# Patient Record
Sex: Male | Born: 1935 | Race: White | Hispanic: No | Marital: Single | Smoking: Former smoker
Health system: Southern US, Community
[De-identification: ages and names within clinical notes are randomized; demographics above are authoritative.]

## PROBLEM LIST (undated history)

## (undated) DIAGNOSIS — R931 Abnormal findings on diagnostic imaging of heart and coronary circulation: Secondary | ICD-10-CM

## (undated) DIAGNOSIS — I700 Atherosclerosis of aorta: Secondary | ICD-10-CM

## (undated) DIAGNOSIS — F32A Depression, unspecified: Secondary | ICD-10-CM

## (undated) DIAGNOSIS — E538 Deficiency of other specified B group vitamins: Secondary | ICD-10-CM

## (undated) DIAGNOSIS — C801 Malignant (primary) neoplasm, unspecified: Secondary | ICD-10-CM

## (undated) DIAGNOSIS — I38 Endocarditis, valve unspecified: Secondary | ICD-10-CM

## (undated) DIAGNOSIS — H9190 Unspecified hearing loss, unspecified ear: Secondary | ICD-10-CM

## (undated) DIAGNOSIS — I2510 Atherosclerotic heart disease of native coronary artery without angina pectoris: Secondary | ICD-10-CM

## (undated) DIAGNOSIS — I10 Essential (primary) hypertension: Secondary | ICD-10-CM

## (undated) DIAGNOSIS — R7303 Prediabetes: Secondary | ICD-10-CM

## (undated) DIAGNOSIS — I499 Cardiac arrhythmia, unspecified: Secondary | ICD-10-CM

## (undated) DIAGNOSIS — E785 Hyperlipidemia, unspecified: Secondary | ICD-10-CM

## (undated) DIAGNOSIS — I429 Cardiomyopathy, unspecified: Secondary | ICD-10-CM

## (undated) DIAGNOSIS — I4891 Unspecified atrial fibrillation: Secondary | ICD-10-CM

## (undated) DIAGNOSIS — D494 Neoplasm of unspecified behavior of bladder: Secondary | ICD-10-CM

## (undated) DIAGNOSIS — Z7901 Long term (current) use of anticoagulants: Secondary | ICD-10-CM

## (undated) DIAGNOSIS — Z9289 Personal history of other medical treatment: Secondary | ICD-10-CM

## (undated) DIAGNOSIS — R011 Cardiac murmur, unspecified: Secondary | ICD-10-CM

## (undated) DIAGNOSIS — F419 Anxiety disorder, unspecified: Secondary | ICD-10-CM

## (undated) DIAGNOSIS — K429 Umbilical hernia without obstruction or gangrene: Secondary | ICD-10-CM

## (undated) HISTORY — PX: OTHER SURGICAL HISTORY: SHX169

---

## 1999-05-20 DIAGNOSIS — C801 Malignant (primary) neoplasm, unspecified: Secondary | ICD-10-CM

## 1999-05-20 DIAGNOSIS — Z9221 Personal history of antineoplastic chemotherapy: Secondary | ICD-10-CM

## 1999-05-20 DIAGNOSIS — C20 Malignant neoplasm of rectum: Secondary | ICD-10-CM

## 1999-05-20 HISTORY — DX: Malignant (primary) neoplasm, unspecified: C80.1

## 1999-05-20 HISTORY — PX: LAPAROSCOPIC LOW ANTERIOR RESECTION: SHX5904

## 1999-05-20 HISTORY — DX: Personal history of antineoplastic chemotherapy: Z92.21

## 1999-05-20 HISTORY — DX: Malignant neoplasm of rectum: C20

## 2002-05-19 HISTORY — PX: HERNIA REPAIR: SHX51

## 2004-10-14 ENCOUNTER — Ambulatory Visit: Payer: Self-pay | Admitting: Oncology

## 2005-04-16 ENCOUNTER — Ambulatory Visit: Payer: Self-pay | Admitting: Oncology

## 2005-04-18 ENCOUNTER — Ambulatory Visit: Payer: Self-pay | Admitting: Oncology

## 2005-10-20 ENCOUNTER — Ambulatory Visit: Payer: Self-pay | Admitting: Oncology

## 2005-10-23 ENCOUNTER — Ambulatory Visit: Payer: Self-pay | Admitting: Unknown Physician Specialty

## 2005-11-16 ENCOUNTER — Ambulatory Visit: Payer: Self-pay | Admitting: Oncology

## 2006-04-20 ENCOUNTER — Ambulatory Visit: Payer: Self-pay | Admitting: Oncology

## 2006-05-19 ENCOUNTER — Ambulatory Visit: Payer: Self-pay | Admitting: Oncology

## 2006-09-03 ENCOUNTER — Ambulatory Visit: Payer: Self-pay | Admitting: Unknown Physician Specialty

## 2006-09-17 ENCOUNTER — Ambulatory Visit: Payer: Self-pay | Admitting: Oncology

## 2006-09-22 ENCOUNTER — Ambulatory Visit: Payer: Self-pay | Admitting: Oncology

## 2006-10-18 ENCOUNTER — Ambulatory Visit: Payer: Self-pay | Admitting: Oncology

## 2007-03-20 ENCOUNTER — Ambulatory Visit: Payer: Self-pay | Admitting: Oncology

## 2007-03-23 ENCOUNTER — Ambulatory Visit: Payer: Self-pay | Admitting: Oncology

## 2007-04-19 ENCOUNTER — Ambulatory Visit: Payer: Self-pay | Admitting: Oncology

## 2008-03-19 ENCOUNTER — Ambulatory Visit: Payer: Self-pay | Admitting: Oncology

## 2008-03-21 ENCOUNTER — Ambulatory Visit: Payer: Self-pay | Admitting: Oncology

## 2008-04-18 ENCOUNTER — Ambulatory Visit: Payer: Self-pay | Admitting: Oncology

## 2008-11-16 ENCOUNTER — Ambulatory Visit: Payer: Self-pay | Admitting: Unknown Physician Specialty

## 2009-03-19 ENCOUNTER — Ambulatory Visit: Payer: Self-pay | Admitting: Oncology

## 2009-03-21 ENCOUNTER — Ambulatory Visit: Payer: Self-pay | Admitting: Oncology

## 2009-04-18 ENCOUNTER — Ambulatory Visit: Payer: Self-pay | Admitting: Oncology

## 2010-03-21 ENCOUNTER — Ambulatory Visit: Payer: Self-pay | Admitting: Oncology

## 2010-03-23 LAB — CEA: CEA: 2.6 ng/mL (ref 0.0–4.7)

## 2010-04-18 ENCOUNTER — Ambulatory Visit: Payer: Self-pay | Admitting: Oncology

## 2011-07-31 ENCOUNTER — Ambulatory Visit: Payer: Self-pay | Admitting: Unknown Physician Specialty

## 2013-06-06 ENCOUNTER — Ambulatory Visit: Payer: Self-pay | Admitting: Unknown Physician Specialty

## 2013-06-09 LAB — PATHOLOGY REPORT

## 2015-02-21 ENCOUNTER — Ambulatory Visit
Admission: RE | Admit: 2015-02-21 | Discharge: 2015-02-21 | Disposition: A | Discharge status: Home / Self Care | Payer: Medicare Other | Source: Ambulatory Visit | Attending: Internal Medicine | Admitting: Internal Medicine

## 2015-02-21 ENCOUNTER — Other Ambulatory Visit: Payer: Self-pay | Admitting: Internal Medicine

## 2015-02-21 DIAGNOSIS — M1612 Unilateral primary osteoarthritis, left hip: Secondary | ICD-10-CM | POA: Insufficient documentation

## 2015-02-21 DIAGNOSIS — M25552 Pain in left hip: Secondary | ICD-10-CM | POA: Insufficient documentation

## 2015-02-21 DIAGNOSIS — M25452 Effusion, left hip: Secondary | ICD-10-CM | POA: Insufficient documentation

## 2016-05-07 ENCOUNTER — Other Ambulatory Visit: Payer: Self-pay | Admitting: Cardiology

## 2016-05-07 DIAGNOSIS — R42 Dizziness and giddiness: Secondary | ICD-10-CM

## 2016-05-07 DIAGNOSIS — H9192 Unspecified hearing loss, left ear: Secondary | ICD-10-CM

## 2016-05-16 ENCOUNTER — Ambulatory Visit
Admission: RE | Admit: 2016-05-16 | Discharge: 2016-05-16 | Disposition: A | Discharge status: Home / Self Care | Payer: Medicare Other | Source: Ambulatory Visit | Attending: Cardiology | Admitting: Cardiology

## 2016-05-16 ENCOUNTER — Other Ambulatory Visit (HOSPITAL_COMMUNITY)
Admission: RE | Admit: 2016-05-16 | Discharge: 2016-05-16 | Disposition: A | Discharge status: Home / Self Care | Payer: Medicare Other | Source: Ambulatory Visit | Attending: Cardiology | Admitting: Cardiology

## 2016-05-16 DIAGNOSIS — I6782 Cerebral ischemia: Secondary | ICD-10-CM | POA: Insufficient documentation

## 2016-05-16 DIAGNOSIS — I10 Essential (primary) hypertension: Secondary | ICD-10-CM | POA: Insufficient documentation

## 2016-05-16 DIAGNOSIS — H9192 Unspecified hearing loss, left ear: Secondary | ICD-10-CM | POA: Insufficient documentation

## 2016-05-16 DIAGNOSIS — R42 Dizziness and giddiness: Secondary | ICD-10-CM

## 2016-05-16 LAB — BUN: BUN: 16 mg/dL (ref 6–20)

## 2016-05-16 LAB — CREATININE, SERUM
Creatinine, Ser: 0.83 mg/dL (ref 0.61–1.24)
GFR calc Af Amer: >60 mL/min (ref >60)
GFR calc non Af Amer: >60 mL/min (ref >60)

## 2016-05-16 MED ORDER — GADOBENATE DIMEGLUMINE 529 MG/ML IV SOLN
17.0000 mL | Freq: Once | INTRAVENOUS | Status: AC | PRN
  Administered 2016-05-16: 17 mL via INTRAVENOUS

## 2018-05-19 HISTORY — PX: EYE SURGERY: SHX253

## 2019-04-01 ENCOUNTER — Encounter: Payer: Self-pay | Admitting: *Deleted

## 2019-04-05 ENCOUNTER — Other Ambulatory Visit: Payer: Self-pay

## 2019-04-05 ENCOUNTER — Other Ambulatory Visit
Admission: RE | Admit: 2019-04-05 | Discharge: 2019-04-05 | Disposition: A | Discharge status: Home / Self Care | Payer: Medicare Other | Source: Ambulatory Visit | Attending: Ophthalmology | Admitting: Ophthalmology

## 2019-04-05 DIAGNOSIS — Z20828 Contact with and (suspected) exposure to other viral communicable diseases: Secondary | ICD-10-CM | POA: Insufficient documentation

## 2019-04-05 DIAGNOSIS — Z01812 Encounter for preprocedural laboratory examination: Secondary | ICD-10-CM | POA: Diagnosis present

## 2019-04-06 LAB — SARS CORONAVIRUS 2 (TAT 6-24 HRS): SARS Coronavirus 2: NEGATIVE

## 2019-04-08 ENCOUNTER — Other Ambulatory Visit: Payer: Self-pay

## 2019-04-08 ENCOUNTER — Ambulatory Visit: Payer: Medicare Other | Admitting: Certified Registered"

## 2019-04-08 ENCOUNTER — Ambulatory Visit
Admission: RE | Admit: 2019-04-08 | Discharge: 2019-04-08 | Disposition: A | Discharge status: Home / Self Care | Payer: Medicare Other | Attending: Ophthalmology | Admitting: Ophthalmology

## 2019-04-08 ENCOUNTER — Encounter
Admission: RE | Disposition: A | Discharge status: Home / Self Care | Payer: Self-pay | Source: Home / Self Care | Attending: Ophthalmology

## 2019-04-08 DIAGNOSIS — Z923 Personal history of irradiation: Secondary | ICD-10-CM | POA: Insufficient documentation

## 2019-04-08 DIAGNOSIS — H9190 Unspecified hearing loss, unspecified ear: Secondary | ICD-10-CM | POA: Diagnosis not present

## 2019-04-08 DIAGNOSIS — H2511 Age-related nuclear cataract, right eye: Secondary | ICD-10-CM | POA: Diagnosis present

## 2019-04-08 DIAGNOSIS — Z880 Allergy status to penicillin: Secondary | ICD-10-CM | POA: Insufficient documentation

## 2019-04-08 DIAGNOSIS — I4891 Unspecified atrial fibrillation: Secondary | ICD-10-CM | POA: Diagnosis not present

## 2019-04-08 DIAGNOSIS — Z87891 Personal history of nicotine dependence: Secondary | ICD-10-CM | POA: Insufficient documentation

## 2019-04-08 DIAGNOSIS — Z85048 Personal history of other malignant neoplasm of rectum, rectosigmoid junction, and anus: Secondary | ICD-10-CM | POA: Insufficient documentation

## 2019-04-08 DIAGNOSIS — Z7901 Long term (current) use of anticoagulants: Secondary | ICD-10-CM | POA: Diagnosis not present

## 2019-04-08 DIAGNOSIS — Z79899 Other long term (current) drug therapy: Secondary | ICD-10-CM | POA: Diagnosis not present

## 2019-04-08 DIAGNOSIS — Z9221 Personal history of antineoplastic chemotherapy: Secondary | ICD-10-CM | POA: Diagnosis not present

## 2019-04-08 HISTORY — PX: CATARACT EXTRACTION W/PHACO: SHX586

## 2019-04-08 HISTORY — DX: Personal history of other medical treatment: Z92.89

## 2019-04-08 HISTORY — DX: Cardiac arrhythmia, unspecified: I49.9

## 2019-04-08 HISTORY — DX: Unspecified hearing loss, unspecified ear: H91.90

## 2019-04-08 HISTORY — DX: Malignant (primary) neoplasm, unspecified: C80.1

## 2019-04-08 SURGERY — PHACOEMULSIFICATION, CATARACT, WITH IOL INSERTION
Anesthesia: Monitor Anesthesia Care | Site: Eye | Laterality: Right

## 2019-04-08 MED ORDER — SODIUM CHLORIDE 0.9 % IV SOLN
INTRAVENOUS | Status: DC
  Administered 2019-04-08: 08:00:00 via INTRAVENOUS

## 2019-04-08 MED ORDER — ONDANSETRON HCL 4 MG/2ML IJ SOLN
INTRAMUSCULAR | Status: DC | PRN
  Administered 2019-04-08: 4 mg via INTRAVENOUS

## 2019-04-08 MED ORDER — FENTANYL CITRATE (PF) 100 MCG/2ML IJ SOLN
INTRAMUSCULAR | Status: AC
  Filled 2019-04-08: qty 2

## 2019-04-08 MED ORDER — POVIDONE-IODINE 5 % OP SOLN
OPHTHALMIC | Status: DC | PRN
  Administered 2019-04-08: 1 via OPHTHALMIC

## 2019-04-08 MED ORDER — TETRACAINE HCL 0.5 % OP SOLN
OPHTHALMIC | Status: AC
  Administered 2019-04-08: 1 [drp] via OPHTHALMIC
  Filled 2019-04-08: qty 4

## 2019-04-08 MED ORDER — MIDAZOLAM HCL 2 MG/2ML IJ SOLN
INTRAMUSCULAR | Status: AC
  Filled 2019-04-08: qty 2

## 2019-04-08 MED ORDER — NA CHONDROIT SULF-NA HYALURON 40-17 MG/ML IO SOLN
INTRAOCULAR | Status: DC | PRN
  Administered 2019-04-08: 1 mL via INTRAOCULAR

## 2019-04-08 MED ORDER — MIDAZOLAM HCL 2 MG/2ML IJ SOLN
INTRAMUSCULAR | Status: DC | PRN
  Administered 2019-04-08 (×2): 1 mg via INTRAVENOUS

## 2019-04-08 MED ORDER — MOXIFLOXACIN HCL 0.5 % OP SOLN
OPHTHALMIC | Status: DC | PRN
  Administered 2019-04-08: 0.2 mL via OPHTHALMIC

## 2019-04-08 MED ORDER — ARMC OPHTHALMIC DILATING DROPS
OPHTHALMIC | Status: AC
  Administered 2019-04-08: 1 via OPHTHALMIC
  Filled 2019-04-08: qty 0.5

## 2019-04-08 MED ORDER — MOXIFLOXACIN HCL 0.5 % OP SOLN: 1.0000 [drp] | Freq: Once | OPHTHALMIC | Status: DC

## 2019-04-08 MED ORDER — ARMC OPHTHALMIC DILATING DROPS
1.0000 "application " | OPHTHALMIC | Status: AC
  Administered 2019-04-08 (×3): 1 via OPHTHALMIC

## 2019-04-08 MED ORDER — LIDOCAINE HCL (PF) 4 % IJ SOLN
INTRAOCULAR | Status: DC | PRN
  Administered 2019-04-08: 4 mL via OPHTHALMIC

## 2019-04-08 MED ORDER — MOXIFLOXACIN HCL 0.5 % OP SOLN
OPHTHALMIC | Status: AC
  Filled 2019-04-08: qty 3

## 2019-04-08 MED ORDER — EPINEPHRINE PF 1 MG/ML IJ SOLN
INTRAOCULAR | Status: DC | PRN
  Administered 2019-04-08: 09:00:00 via OPHTHALMIC

## 2019-04-08 MED ORDER — FENTANYL CITRATE (PF) 100 MCG/2ML IJ SOLN
INTRAMUSCULAR | Status: DC | PRN
  Administered 2019-04-08 (×2): 50 ug via INTRAVENOUS

## 2019-04-08 MED ORDER — TETRACAINE HCL 0.5 % OP SOLN
1.0000 [drp] | Freq: Once | OPHTHALMIC | Status: AC
  Administered 2019-04-08: 08:00:00 1 [drp] via OPHTHALMIC

## 2019-04-08 MED ORDER — CARBACHOL 0.01 % IO SOLN
INTRAOCULAR | Status: DC | PRN
  Administered 2019-04-08: 0.5 mL via INTRAOCULAR

## 2019-04-08 SURGICAL SUPPLY — 16 items
GLOVE BIO SURGEON STRL SZ8 (GLOVE) ×3 IMPLANT
GLOVE BIOGEL M 6.5 STRL (GLOVE) ×3 IMPLANT
GLOVE SURG LX 8.0 MICRO (GLOVE) ×2
GLOVE SURG LX STRL 8.0 MICRO (GLOVE) ×1 IMPLANT
GOWN STRL REUS W/ TWL LRG LVL3 (GOWN DISPOSABLE) ×2 IMPLANT
GOWN STRL REUS W/TWL LRG LVL3 (GOWN DISPOSABLE) ×4
LABEL CATARACT MEDS ST (LABEL) ×3 IMPLANT
LENS IOL TECNIS ITEC 17.0 (Intraocular Lens) ×2 IMPLANT
PACK CATARACT (MISCELLANEOUS) ×3 IMPLANT
PACK CATARACT BRASINGTON LX (MISCELLANEOUS) ×3 IMPLANT
PACK EYE AFTER SURG (MISCELLANEOUS) ×3 IMPLANT
SOL BSS BAG (MISCELLANEOUS) ×3
SOLUTION BSS BAG (MISCELLANEOUS) ×1 IMPLANT
SYR 5ML LL (SYRINGE) ×3 IMPLANT
WATER STERILE IRR 250ML POUR (IV SOLUTION) ×3 IMPLANT
WIPE NON LINTING 3.25X3.25 (MISCELLANEOUS) ×3 IMPLANT

## 2019-04-08 NOTE — Anesthesia Postprocedure Evaluation (Signed)
Anesthesia Post Note  Patient: GILLIE CRISCI  Procedure(s) Performed: CATARACT EXTRACTION PHACO AND INTRAOCULAR LENS PLACEMENT (IOC) RIGHT (Right Eye)  Patient location during evaluation: PACU Anesthesia Type: MAC Level of consciousness: awake and alert Pain management: pain level controlled Vital Signs Assessment: post-procedure vital signs reviewed and stable Respiratory status: spontaneous breathing, nonlabored ventilation, respiratory function stable and patient connected to nasal cannula oxygen Cardiovascular status: stable and blood pressure returned to baseline Postop Assessment: no apparent nausea or vomiting Anesthetic complications: no     Last Vitals:  Vitals:   04/08/19 0725 04/08/19 0918  BP: 140/89 118/68  Pulse: 85 69  Resp: 16 14  Temp: (!) 36.1 C (!) 36.3 C  SpO2: 100% 100%    Last Pain:  Vitals:   04/08/19 0918  TempSrc: Temporal  PainSc: 0-No pain                 Martha Clan

## 2019-04-08 NOTE — Anesthesia Preprocedure Evaluation (Signed)
Anesthesia Evaluation  Patient identified by MRN, date of birth, ID band Patient awake    Reviewed: Allergy & Precautions, H&P , NPO status , Patient's Chart, lab work & pertinent test results, reviewed documented beta blocker date and time   History of Anesthesia Complications Negative for: history of anesthetic complications  Airway Mallampati: I  TM Distance: >3 FB Neck ROM: full    Dental  (+) Dental Advidsory Given, Caps, Teeth Intact Permanent briges:   Pulmonary neg pulmonary ROS, former smoker,    Pulmonary exam normal        Cardiovascular Exercise Tolerance: Good (-) hypertension(-) angina(-) Past MI and (-) Cardiac Stents + dysrhythmias Atrial Fibrillation (-) Valvular Problems/Murmurs     Neuro/Psych negative neurological ROS  negative psych ROS   GI/Hepatic negative GI ROS, Neg liver ROS,   Endo/Other  negative endocrine ROS  Renal/GU negative Renal ROS  negative genitourinary   Musculoskeletal   Abdominal   Peds  Hematology negative hematology ROS (+)   Anesthesia Other Findings Past Medical History: No date: Cancer ([place])     Comment:  rectal cancer No date: Dysrhythmia     Comment:  atrial fibrillation No date: Hard of hearing No date: History of blood transfusion 2001: History of chemotherapy     Comment:  radiation too   Reproductive/Obstetrics negative OB ROS                             Anesthesia Physical Anesthesia Plan  ASA: II  Anesthesia Plan: MAC   Post-op Pain Management:    Induction:   PONV Risk Score and Plan:   Airway Management Planned: Natural Airway and Nasal Cannula  Additional Equipment:   Intra-op Plan:   Post-operative Plan:   Informed Consent: I have reviewed the patients History and Physical, chart, labs and discussed the procedure including the risks, benefits and alternatives for the proposed anesthesia with the patient or  authorized representative who has indicated his/her understanding and acceptance.     Dental Advisory Given  Plan Discussed with: Anesthesiologist, CRNA and Surgeon  Anesthesia Plan Comments:         Anesthesia Quick Evaluation

## 2019-04-08 NOTE — Discharge Instructions (Addendum)
AMBULATORY SURGERY  DISCHARGE INSTRUCTIONS   1) The drugs that you were given will stay in your system until tomorrow so for the next 24 hours you should not:  A) Drive an automobile B) Make any legal decisions C) Drink any alcoholic beverage   2) You may resume regular meals tomorrow.  Today it is better to start with liquids and gradually work up to solid foods.  You may eat anything you prefer, but it is better to start with liquids, then soup and crackers, and gradually work up to solid foods.   3) Please notify your doctor immediately if you have any unusual bleeding, trouble breathing, redness and pain at the surgery site, drainage, fever, or pain not relieved by medication.    4) Additional Instructions:        Please contact your physician with any problems or Same Day Surgery at [phone], Monday through Friday 6 am to 4 pm, or  at [facility] number at [phone].Eye Surgery Discharge Instructions    Expect mild scratchy sensation or mild soreness. DO NOT RUB YOUR EYE!  The day of surgery:  Minimal physical activity, but bed rest is not required  No reading, computer work, or close hand work  No bending, lifting, or straining.  May watch TV  For 24 hours:  No driving, legal decisions, or alcoholic beverages  Safety precautions  Eat anything you prefer: It is better to start with liquids, then soup then solid foods.  _____ Eye patch should be worn until postoperative exam tomorrow.  ____ Solar shield eyeglasses should be worn for comfort in the sunlight/patch while sleeping  Resume all regular medications including aspirin or Coumadin if these were discontinued prior to surgery. You may shower, bathe, shave, or wash your hair. Tylenol may be taken for mild discomfort.  Call your doctor if you experience significant pain, nausea, or vomiting, fever > 101 or other signs of infection. [phone] or [phone] Specific  instructions:  Follow-up Information    Birder Robson, MD Follow up on 04/08/2019.   Specialty: Ophthalmology Why: appointment time 3:15 PM Contact information: [address] [place] [address] [phone]

## 2019-04-08 NOTE — Anesthesia Post-op Follow-up Note (Signed)
Anesthesia QCDR form completed.        

## 2019-04-08 NOTE — Transfer of Care (Signed)
Immediate Anesthesia Transfer of Care Note  Patient: Sean Anthony  Procedure(s) Performed: CATARACT EXTRACTION PHACO AND INTRAOCULAR LENS PLACEMENT (IOC) RIGHT (Right Eye)  Patient Location: PACU  Anesthesia Type:MAC  Level of Consciousness: awake, alert  and oriented  Airway & Oxygen Therapy: Patient Spontanous Breathing and Patient connected to nasal cannula oxygen  Post-op Assessment: Report given to RN and Post -op Vital signs reviewed and stable  Post vital signs: Reviewed and stable  Last Vitals:  Vitals Value Taken Time  BP    Temp    Pulse    Resp    SpO2      Last Pain:  Vitals:   04/08/19 0725  TempSrc: Temporal  PainSc: 0-No pain         Complications: No apparent anesthesia complications

## 2019-04-08 NOTE — H&P (Signed)
All labs reviewed. Abnormal studies sent to patients PCP when indicated.  Previous H&P reviewed, patient examined, there are NO CHANGES.  Sean Kopplin Porfilio11/20/20207:18 AM

## 2019-04-08 NOTE — Op Note (Signed)
PREOPERATIVE DIAGNOSIS:  Nuclear sclerotic cataract of the right eye.   POSTOPERATIVE DIAGNOSIS:  NUCLEAR SCLEROTIC CATARACT RIGHT EYE   OPERATIVE PROCEDURE: Procedure(s): CATARACT EXTRACTION PHACO AND INTRAOCULAR LENS PLACEMENT ([place]) RIGHT   SURGEON:  Birder Robson, MD.   ANESTHESIA:  Anesthesiologist: Martha Clan, MD CRNA: Disser, Einar Grad, CRNA  1.      Managed anesthesia care. 2.      0.43ml of Shugarcaine was instilled in the eye following the paracentesis.   COMPLICATIONS:  None.   TECHNIQUE:   Stop and chop   DESCRIPTION OF PROCEDURE:  The patient was examined and consented in the preoperative holding area where the aforementioned topical anesthesia was applied to the right eye and then brought back to the Operating Room where the right eye was prepped and draped in the usual sterile ophthalmic fashion and a lid speculum was placed. A paracentesis was created with the side port blade and the anterior chamber was filled with viscoelastic. A near clear corneal incision was performed with the steel keratome. A continuous curvilinear capsulorrhexis was performed with a cystotome followed by the capsulorrhexis forceps. Hydrodissection and hydrodelineation were carried out with BSS on a blunt cannula. The lens was removed in a stop and chop  technique and the remaining cortical material was removed with the irrigation-aspiration handpiece. The capsular bag was inflated with viscoelastic and the Technis [id number]  lens was placed in the capsular bag without complication. The remaining viscoelastic was removed from the eye with the irrigation-aspiration handpiece. The wounds were hydrated. The anterior chamber was flushed with Miostat and the eye was inflated to physiologic pressure. 0.3ml of Vigamox was placed in the anterior chamber. The wounds were found to be water tight. The eye was dressed with Vigamox. The patient was given protective glasses to wear throughout the day and a shield with  which to sleep tonight. The patient was also given drops with which to begin a drop regimen today and will follow-up with me in one day. Implant Name Type Inv. Item Serial No. Manufacturer Lot No. LRB No. Used Action  LENS IOL DIOP 17.0 - [id number] Intraocular Lens LENS IOL DIOP 17.0 225 [id number] AMO  Right 1 Implanted   Procedure(s) with comments: CATARACT EXTRACTION PHACO AND INTRAOCULAR LENS PLACEMENT (IOC) RIGHT (Right) - [place] 00:56.6 CDE 10.34 FLUID PACK LOT # [id number]  Electronically signed: Birder Robson 04/08/2019 9:16 AM

## 2019-05-20 DIAGNOSIS — Z9841 Cataract extraction status, right eye: Secondary | ICD-10-CM

## 2019-05-20 HISTORY — DX: Cataract extraction status, right eye: Z98.41

## 2021-06-25 ENCOUNTER — Other Ambulatory Visit: Payer: Self-pay | Admitting: Gerontology

## 2021-06-25 DIAGNOSIS — R319 Hematuria, unspecified: Secondary | ICD-10-CM

## 2021-07-01 ENCOUNTER — Other Ambulatory Visit: Payer: Self-pay | Admitting: *Deleted

## 2021-07-01 DIAGNOSIS — R310 Gross hematuria: Secondary | ICD-10-CM

## 2021-07-02 ENCOUNTER — Other Ambulatory Visit: Payer: Self-pay

## 2021-07-02 ENCOUNTER — Ambulatory Visit: Payer: Medicare Other | Admitting: Urology

## 2021-07-02 ENCOUNTER — Encounter: Payer: Self-pay | Admitting: Urology

## 2021-07-02 VITALS — BP 114/55 | HR 86 | Ht 74.0 in | Wt 174.0 lb

## 2021-07-02 DIAGNOSIS — R310 Gross hematuria: Secondary | ICD-10-CM

## 2021-07-02 NOTE — Progress Notes (Signed)
° °  07/02/21 10:22 AM   Star Age [birth year] [id number]  CC: Gross hematuria  HPI: 86 year old male referred for gross hematuria.  He reports about 6 weeks of gross hematuria without clots that has been intermittent.  This resolved after stopping his Xarelto, and he has been back on it without gross hematuria for the last week.  Urinalysis was checked by PCP at time of gross hematuria and showed 10-50 RBCs but no evidence of infection.  He denies any urologic history, however has a history of rectal cancer treated with chemotherapy and radiation 20 years ago.  He has a 10-pack-year smoking history and quit 40 years ago, no other carcinogenic exposures.  He denies any dysuria or UTI symptoms.   PMH: Past Medical History:  Diagnosis Date   Cancer [facility])    rectal cancer   Dysrhythmia    atrial fibrillation   Hard of hearing    History of blood transfusion    History of chemotherapy 2001   radiation too    Surgical History: Past Surgical History:  Procedure Laterality Date   CATARACT EXTRACTION W/PHACO Right 04/08/2019   Procedure: CATARACT EXTRACTION PHACO AND INTRAOCULAR LENS PLACEMENT ([place]) RIGHT;  Surgeon: Birder Robson, MD;  Location: [facility] ORS;  Service: Ophthalmology;  Laterality: Right;  [place] 00:56.6 CDE 10.34 FLUID PACK LOT # [id number] H   dental implants     HERNIA REPAIR     LAPAROSCOPIC LOW ANTERIOR RESECTION  2001   also had chemo and radiation therapy    Family History: No family history on file.  Social History:  reports that he has quit smoking. He has never used smokeless tobacco. He reports current alcohol use. No history on file for drug use.  Physical Exam: BP (!) 114/55 (BP Location: Left Arm, Patient Position: Sitting, Cuff Size: Normal)    Pulse 86    Ht 6\' 2"  (1.88 m)    Wt 174 lb (78.9 kg)    BMI 22.34 kg/m    Constitutional:  Alert and oriented, No acute distress. Cardiovascular: No clubbing, cyanosis, or edema. Respiratory: Normal  respiratory effort, no increased work of breathing. GI: Abdomen is soft, nontender, nondistended, no abdominal masses  Assessment & Plan:   86 year old male with 10-pack-year smoking history and history of rectal cancer treated with surgery, radiation, and chemotherapy 20 years ago who presents with asymptomatic gross hematuria on Xarelto.  We discussed common possible etiologies of hematuria including BPH, malignancy, urolithiasis, medical renal disease, and idiopathic. Standard workup recommended by the AUA includes imaging with CT urogram to assess the upper tracts, and cystoscopy. Cytology is performed on patient's with gross hematuria to look for malignant cells in the urine.  CT hematuria and cystoscopy to complete work-up  Nickolas Madrid, MD 07/02/2021  [place] [address], [place], [place] [address] [phone]

## 2021-07-02 NOTE — Patient Instructions (Signed)

## 2021-08-07 ENCOUNTER — Other Ambulatory Visit: Payer: Medicare Other | Admitting: Urology

## 2021-08-20 ENCOUNTER — Ambulatory Visit
Admission: RE | Admit: 2021-08-20 | Discharge: 2021-08-20 | Disposition: A | Discharge status: Home / Self Care | Payer: Medicare Other | Source: Ambulatory Visit | Attending: Urology | Admitting: Urology

## 2021-08-20 ENCOUNTER — Other Ambulatory Visit: Payer: Self-pay

## 2021-08-20 DIAGNOSIS — R310 Gross hematuria: Secondary | ICD-10-CM | POA: Diagnosis present

## 2021-08-20 LAB — POCT I-STAT CREATININE: Creatinine, Ser: 0.9 mg/dL (ref 0.61–1.24)

## 2021-08-20 MED ORDER — IOHEXOL 300 MG/ML  SOLN: 100.0000 mL | Freq: Once | INTRAMUSCULAR | Status: DC | PRN

## 2021-08-20 MED ORDER — IOHEXOL 300 MG/ML  SOLN
125.0000 mL | Freq: Once | INTRAMUSCULAR | Status: AC | PRN
  Administered 2021-08-20: 125 mL via INTRAVENOUS

## 2021-08-21 ENCOUNTER — Encounter: Payer: Self-pay | Admitting: Urology

## 2021-08-21 ENCOUNTER — Other Ambulatory Visit
Admission: RE | Admit: 2021-08-21 | Discharge: 2021-08-21 | Disposition: A | Discharge status: Home / Self Care | Payer: Medicare Other | Attending: Urology | Admitting: Urology

## 2021-08-21 ENCOUNTER — Ambulatory Visit: Payer: Medicare Other | Admitting: Urology

## 2021-08-21 ENCOUNTER — Other Ambulatory Visit: Payer: Self-pay | Admitting: Urology

## 2021-08-21 VITALS — BP 122/73 | HR 77 | Ht 74.0 in | Wt 180.0 lb

## 2021-08-21 DIAGNOSIS — D494 Neoplasm of unspecified behavior of bladder: Secondary | ICD-10-CM

## 2021-08-21 DIAGNOSIS — R310 Gross hematuria: Secondary | ICD-10-CM

## 2021-08-21 LAB — URINALYSIS, COMPLETE (UACMP) WITH MICROSCOPIC
Bilirubin Urine: NEGATIVE
Glucose, UA: NEGATIVE mg/dL
Ketones, ur: NEGATIVE mg/dL
Leukocytes,Ua: NEGATIVE
Nitrite: NEGATIVE
Protein, ur: NEGATIVE mg/dL
RBC / HPF: >50 RBC/hpf (ref 0–5)
Specific Gravity, Urine: 1.02 (ref 1.005–1.030)
pH: 5.5 (ref 5.0–8.0)

## 2021-08-21 NOTE — Progress Notes (Signed)
Cystoscopy Procedure Note: ? ?Indication: Gross hematuria ? ?After informed consent and discussion of the procedure and its risks, Sean Anthony was positioned and prepped in the standard fashion. Cystoscopy was performed with a flexible cystoscope. The urethra, bladder neck and entire bladder was visualized in a standard fashion. The prostate was moderate in size.  There was a small 2 cm papillary lesion at the posterior wall concerning for bladder tumor.  Cytology sent ? ?Imaging: ?CT reviewed, asymmetric right and posterior bladder wall enhancement consistent with small tumor identified on cystoscopy today ? ?Findings: ?Small papillary bladder tumor. ~2cm ? ?-------------------------------------------------------------------------- ? ?Assessment and Plan: ?86 year old male who presented with gross hematuria and was found to have a 2 cm papillary bladder tumor on clinic cystoscopy today. ? ?We discussed transurethral resection of bladder tumor (TURBT) and risks and benefits at length. This is typically a 1 to 2-hour procedure done under general anesthesia in the operating room.  A scope is inserted through the urethra and used to resect abnormal tissue within the bladder, which is then sent to the pathologist to determine grade and stage of the tumor.  Risks include bleeding, infection, need for temporary Foley placement, and bladder perforation.  Treatment strategies are based on the type of tumor and depth of invasion.  We briefly reviewed the different treatment pathways for non-muscle invasive and muscle invasive bladder cancer. ? ?Schedule bladder biopsy and fulguration with gemcitabine ? ?Sean Madrid, MD ?08/21/2021  ? ?

## 2021-08-21 NOTE — Progress Notes (Signed)
Surgical Physician Order Form [facility] [place] ? ?* Scheduling expectation : Next Available ? ?*Length of Case: 1 hour ? ?*Clearance needed: no ? ?*Anticoagulation Instructions: Hold all anticoagulants ? ?*Aspirin Instructions: Hold Aspirin ? ?*Post-op visit Date/Instructions: 2 weeks discuss pathology ? ?*Diagnosis: Bladder Tumor ? ?*Procedure: Bladder biopsy and fulguration ? ? ?Additional orders: Gemcitabine '2000mg'$  bladder instillation ? ?-Admit type: OUTpatient ? ?-Anesthesia: General ? ?-VTE Prophylaxis Standing Order SCD?s    ?   ?Other:  ? ?-Standing Lab Orders Per Anesthesia   ? ?Lab other: UA&Urine Culture ? ?-Standing Test orders EKG/Chest x-ray per Anesthesia      ? ?Test other:  ? ?- Medications:  Ancef 2gm IV ? ?-Other orders:  N/A ? ? ? ?  ? ?

## 2021-08-21 NOTE — Patient Instructions (Signed)
Transurethral Resection of Bladder Tumor ?Transurethral resection of a bladder tumor is the removal (resection) of a cancerous growth (tumor) on the inside wall of the bladder. The bladder is the organ that holds urine. The tumor is removed through the tube that carries urine out of the body (urethra). ?In a transurethral resection, a thin telescope with a light, a tiny camera, and an electric cutting edge (resectoscope) is passed through the urethra. In men, the opening of the urethra is at the end of the penis. In women, it is just above the opening of the vagina. ?Tell a health care provider about: ?Any allergies you have. ?All medicines you are taking, including vitamins, herbs, eye drops, creams, and over-the-counter medicines. ?Any problems you or family members have had with anesthetic medicines. ?Any blood disorders you have. ?Any surgeries you have had. ?Any medical conditions you have. ?Any recent urinary tract infections you have had. ?Whether you are pregnant or may be pregnant. ?What are the risks? ?Generally, this is a safe procedure. However, problems may occur, including: ?Infection. ?Bleeding. ?Allergic reactions to medicines. ?Damage to nearby structures or organs, such as: ?The urethra. ?The tubes that drain urine from the kidneys into the bladder (ureters). ?Pain and burning during urination. ?Difficulty urinating due to partial blockage of the urethra. ?Inability to urinate (urinary retention). ?What happens before the procedure? ?Staying hydrated ?Follow instructions from your health care provider about hydration, which may include: ?Up to 2 hours before the procedure - you may continue to drink clear liquids, such as water, clear fruit juice, black coffee, and plain tea. ? ?Eating and drinking restrictions ?Follow instructions from your health care provider about eating and drinking, which may include: ?8 hours before the procedure - stop eating heavy meals or foods, such as meat, fried foods,  or fatty foods. ?6 hours before the procedure - stop eating light meals or foods, such as toast or cereal. ?6 hours before the procedure - stop drinking milk or drinks that contain milk. ?2 hours before the procedure - stop drinking clear liquids. ?Medicines ?Ask your health care provider about: ?Changing or stopping your regular medicines. This is especially important if you are taking diabetes medicines or blood thinners. ?Taking medicines such as aspirin and ibuprofen. These medicines can thin your blood. Do not take these medicines unless your health care provider tells you to take them. ?Taking over-the-counter medicines, vitamins, herbs, and supplements. ?Tests ?You may have exams or tests, including: ?Physical exam. ?Blood tests. ?Urine tests. ?Electrocardiogram (ECG). This test measures the electrical activity of the heart. ?General instructions ?Plan to have someone take you home from the hospital or clinic. ?Ask your health care provider how your surgical site will be marked or identified. ?Ask your health care provider what steps will be taken to help prevent infection. These may include: ?Washing skin with a germ-killing soap. ?Taking antibiotic medicine. ?What happens during the procedure? ?An IV will be inserted into one of your veins. ?You will be given one or more of the following: ?A medicine to help you relax (sedative). ?A medicine to make you fall asleep (general anesthetic). ?A medicine that is injected into your spine to numb the area below and slightly above the injection site (spinal anesthetic). ?Your legs will be placed in foot rests (stirrups) so that your legs are apart and your knees are bent. ?The resectoscope will be passed through your urethra and into your bladder. ?The part of your bladder that is affected by the tumor will be  resected using the cutting edge of the resectoscope. ?The resectoscope will be removed. ?A thin, flexible tube (catheter) will be passed through your urethra  and into your bladder. The catheter will drain urine into a bag outside of your body. ?Fluid may be passed through the catheter to keep the catheter open. ?The procedure may vary among health care providers and hospitals. ?What happens after the procedure? ?Your blood pressure, heart rate, breathing rate, and blood oxygen level will be monitored until you leave the hospital or clinic. ?You may continue to receive fluids and medicines through an IV. ?You will have some pain. You will be given pain medicine to relieve pain. ?You will have a catheter to drain your urine. ?You will have blood in your urine. Your catheter may be kept in until your urine is clear. ?The amount of urine will be monitored. If necessary, your bladder may be rinsed out (irrigated) by passing fluid through your catheter. ?You will be encouraged to walk around as soon as possible. ?You may have to wear compression stockings. These stockings help to prevent blood clots and reduce swelling in your legs. ?Do not drive for 24 hours if you were given a sedative during your procedure. ?Summary ?Transurethral resection of a bladder tumor is the removal (resection) of a cancerous growth (tumor) on the inside wall of the bladder. ?To do this procedure, your health care provider uses a thin telescope with a light, a tiny camera, and an electric cutting edge (resectoscope). ?Follow your health care provider's instructions. You may need to stop or change certain medicines, and you may be told to stop eating and drinking several hours before the procedure. ?Your blood pressure, heart rate, breathing rate, and blood oxygen level will be monitored until you leave the hospital or clinic. ?You may have to wear compression stockings. These stockings help to prevent blood clots and reduce swelling in your legs. ?This information is not intended to replace advice given to you by your health care provider. Make sure you discuss any questions you have with your  health care provider. ?Document Revised: 12/03/2017 Document Reviewed: 12/04/2017 ?Elsevier Patient Education ? [place]. ? ?Transurethral Resection of Bladder Tumor, Care After ?This sheet gives you information about how to care for yourself after your procedure. Your health care provider may also give you more specific instructions. If you have problems or questions, contact your health care provider. ?What can I expect after the procedure? ?After the procedure, it is common to have: ?A small amount of blood in your urine for up to 2 weeks. ?Soreness or mild pain from your catheter. After your catheter is removed, you may have mild soreness, especially when urinating. ?Pain in your lower abdomen. ?Follow these instructions at home: ?Medicines ? ?Take over-the-counter and prescription medicines only as told by your health care provider. ?If you were prescribed an antibiotic medicine, take it as told by your health care provider. Do not stop taking the antibiotic even if you start to feel better. ?Do not drive for 24 hours if you were given a sedative during your procedure. ?Ask your health care provider if the medicine prescribed to you: ?Requires you to avoid driving or using heavy machinery. ?Can cause constipation. You may need to take these actions to prevent or treat constipation: ?Take over-the-counter or prescription medicines. ?Eat foods that are high in fiber, such as beans, whole grains, and fresh fruits and vegetables. ?Limit foods that are high in fat and processed sugars, such as  fried or sweet foods. ?Activity ?Return to your normal activities as told by your health care provider. Ask your health care provider what activities are safe for you. ?Do not lift anything that is heavier than 10 lb (4.5 kg), or the limit that you are told, until your health care provider says that it is safe. ?Avoid intense physical activity for as long as told by your health care provider. ?Rest as told by your  health care provider. ?Avoid sitting for a long time without moving. Get up to take short walks every 1-2 hours. This is important to improve blood flow and breathing. Ask for help if you feel weak or [place]

## 2021-08-22 ENCOUNTER — Telehealth: Payer: Self-pay

## 2021-08-22 ENCOUNTER — Other Ambulatory Visit: Payer: Medicare Other | Admitting: Urology

## 2021-08-22 LAB — CYTOLOGY - NON PAP

## 2021-08-22 NOTE — Progress Notes (Signed)
[facility] Posting Form  ? ?Surgery Date/Time: Date: 09/06/2021 ? ?Surgeon: Dr. Nickolas Madrid, MD ? ?Surgery Location: Day Surgery ? ?Inpt ( No  )   Outpt (Yes)   Obs ( No  )  ? ?Diagnosis: Bladder Tumor D49.4 ? ?-CPT: 64314, 27670, 51720 ? ?Surgery: Bladder Biopsy with Fulgeration and Intravesical Instillation of Gemcitabine ? ?Stop Anticoagulations: Yes and hold ASA ? ?Cardiac/Medical/Pulmonary Clearance needed: no ? ?*Orders entered into EPIC  Date: 08/22/21  ? ?*Case booked in [place]  Date: 08/22/21 ? ?*Notified pt of Surgery: Date: 08/22/21 ? ?PRE-OP UA & CX: Yes, will obtain in [place] next week ? ?*Placed into Prior Authorization Work Que Date: 08/22/21 ? ? ?Assistant/laser/rep:No ? ? ? ? ? ? ? ? ? ? ? ? ? ? ? ?

## 2021-08-22 NOTE — Telephone Encounter (Signed)
I spoke with Sean Anthony. We have discussed possible surgery dates and Friday April 21st, 2023 was agreed upon by all parties. Patient given information about surgery date, what to expect pre-operatively and post operatively.  ?We discussed that a Pre-Admission Testing office will be calling to set up the pre-op visit that will take place prior to surgery, and that these appointments are typically done over the phone with a Pre-Admissions RN. ? Informed patient that our office will communicate any additional care to be provided after surgery. Patients questions or concerns were discussed during our call. Advised to call our office should there be any additional information, questions or concerns that arise. Patient verbalized understanding.  ? ?

## 2021-08-28 ENCOUNTER — Other Ambulatory Visit
Admission: RE | Admit: 2021-08-28 | Discharge: 2021-08-28 | Disposition: A | Discharge status: Home / Self Care | Payer: Medicare Other | Source: Ambulatory Visit | Attending: Urology | Admitting: Urology

## 2021-08-28 VITALS — Ht 74.0 in | Wt 180.0 lb

## 2021-08-28 DIAGNOSIS — Z01812 Encounter for preprocedural laboratory examination: Secondary | ICD-10-CM

## 2021-08-28 NOTE — Patient Instructions (Addendum)
?Your procedure is scheduled on: Friday September 06, 2021. ?Report to Day Surgery inside [facility] 2nd floor, stop by admissions desk before getting on elevator. ?To find out your arrival time please call [phone] between 1PM - 3PM on Thursday September 05, 2021. ? ?Remember: Instructions that are not followed completely may result in serious medical risk,  ?up to and including death, or upon the discretion of your surgeon and anesthesiologist your  ?surgery may need to be rescheduled.  ? ?  _X__ 1. Do not eat food or drink fluids after midnight the night before your procedure. ?                No chewing gum or hard candies.  ? ?__X__2.  On the morning of surgery brush your teeth with toothpaste and water, you ?               may rinse your mouth with mouthwash if you wish.  Do not swallow any toothpaste or mouthwash. ?   ? _X__ 3.  No Alcohol for 24 hours before or after surgery. ? ? _X__ 4.  Do Not Smoke or use e-cigarettes For 24 Hours Prior to Your Surgery. ?                Do not use any chewable tobacco products for at least 6 hours prior to ?                Surgery. ? ?_X__  5.  Do not use any recreational drugs (marijuana, cocaine, heroin, ecstasy, MDMA or other) ?               For at least one week prior to your surgery.  Combination of these drugs with anesthesia ?               May have life threatening results. ? ?____  6.  Bring all medications with you on the day of surgery if instructed.  ? ?__X__  7.  Notify your doctor if there is any change in your medical condition  ?    (cold, fever, infections). ?    ?Do not wear jewelry, make-up, hairpins, clips or nail polish. ?Do not wear lotions, powders, or perfumes. You may wear deodorant. ?Do not shave 48 hours prior to surgery. Men may shave face and neck. ?Do not bring valuables to the hospital.   ? ?[facility] is not responsible for any belongings or valuables. ? ?Contacts, dentures or bridgework may not be worn into  surgery. ?Leave your suitcase in the car. After surgery it may be brought to your room. ?For patients admitted to the hospital, discharge time is determined by your ?treatment team. ?  ?Patients discharged the day of surgery will not be allowed to drive home.   ?Make arrangements for someone to be with you for the first 24 hours of your ?Same Day Discharge. ? ? ? ?__X__ Take these medicines the morning of surgery with A SIP OF WATER:  ? ? 1. None  ? 2.  ? 3.  ? 4. ? 5. ? 6. ? ?____ Fleet Enema (as directed)  ? ?____ Use CHG Soap (or wipes) as directed ? ?____ Use Benzoyl Peroxide Gel as instructed ? ?____ Use inhalers on the day of surgery ? ?____ Stop metformin 2 days prior to surgery   ? ?____ Take 1/2 of usual insulin dose the night before surgery. No insulin the morning ?  of surgery.  ? ?__X__ Stop rivaroxaban (XARELTO) 20 MG 3 days before your procedure as instructed by your doctor. (Take last dose 09/02/21) ? ?__X__ One Week prior to surgery- Stop Anti-inflammatories such as Ibuprofen, Aleve, Advil, Motrin, meloxicam (MOBIC), diclofenac, etodolac, ketorolac, Toradol, Daypro, piroxicam, Goody's or BC powders. OK TO USE TYLENOL IF NEEDED ?  ?__X__ Stop supplements until after surgery.   ? ?____ Bring C-Pap to the hospital.  ? ? ?If you have any questions regarding your pre-procedure instructions,  ?Please call Pre-admit Testing at [phone] ?

## 2021-08-30 ENCOUNTER — Other Ambulatory Visit
Admission: RE | Admit: 2021-08-30 | Discharge: 2021-08-30 | Disposition: A | Discharge status: Home / Self Care | Payer: Medicare Other | Source: Ambulatory Visit | Attending: Urology | Admitting: Urology

## 2021-08-30 DIAGNOSIS — Z01818 Encounter for other preprocedural examination: Secondary | ICD-10-CM | POA: Insufficient documentation

## 2021-08-30 DIAGNOSIS — Z01810 Encounter for preprocedural cardiovascular examination: Secondary | ICD-10-CM | POA: Diagnosis not present

## 2021-08-30 DIAGNOSIS — R310 Gross hematuria: Secondary | ICD-10-CM | POA: Diagnosis not present

## 2021-08-30 DIAGNOSIS — D494 Neoplasm of unspecified behavior of bladder: Secondary | ICD-10-CM | POA: Insufficient documentation

## 2021-08-30 DIAGNOSIS — Z01812 Encounter for preprocedural laboratory examination: Secondary | ICD-10-CM

## 2021-08-30 LAB — BASIC METABOLIC PANEL
Anion gap: 5 (ref 5–15)
BUN: 18 mg/dL (ref 8–23)
CO2: 29 mmol/L (ref 22–32)
Calcium: 9.4 mg/dL (ref 8.9–10.3)
Chloride: 106 mmol/L (ref 98–111)
Creatinine, Ser: 0.85 mg/dL (ref 0.61–1.24)
GFR, Estimated: >60 mL/min (ref >60)
Glucose, Bld: 101 mg/dL — ABNORMAL HIGH (ref 70–99)
Potassium: 4.2 mmol/L (ref 3.5–5.1)
Sodium: 140 mmol/L (ref 135–145)

## 2021-08-30 LAB — URINALYSIS, ROUTINE W REFLEX MICROSCOPIC
Bacteria, UA: NONE SEEN
Bilirubin Urine: NEGATIVE
Glucose, UA: NEGATIVE mg/dL
Ketones, ur: NEGATIVE mg/dL
Leukocytes,Ua: NEGATIVE
Nitrite: NEGATIVE
Protein, ur: NEGATIVE mg/dL
Specific Gravity, Urine: 1.005 (ref 1.005–1.030)
pH: 6 (ref 5.0–8.0)

## 2021-08-30 LAB — CBC
HCT: 42.1 % (ref 39.0–52.0)
Hemoglobin: 14.2 g/dL (ref 13.0–17.0)
MCH: 32.4 pg (ref 26.0–34.0)
MCHC: 33.7 g/dL (ref 30.0–36.0)
MCV: 96.1 fL (ref 80.0–100.0)
Platelets: 160 10*3/uL (ref 150–400)
RBC: 4.38 MIL/uL (ref 4.22–5.81)
RDW: 12.8 % (ref 11.5–15.5)
WBC: 3.6 10*3/uL — ABNORMAL LOW (ref 4.0–10.5)
nRBC: 0 % (ref 0.0–0.2)

## 2021-08-30 NOTE — Pre-Procedure Instructions (Signed)
Progress Notes ?- documented in this encounter ?Sydnee Levans, MD - 06/06/2021 11:00 AM EST ?Formatting of this note is different from the original. ? ? ?Chief Complaint: ?Chief Complaint  ?Patient presents with  ? 3 month follow up  ?Date of Service: 06/06/2021 ?Date of Birth: [birth year] ?PCP: Lawson Radar, NP  ?History of Present Illness: Mr. Kentner is a 86 y.o.male patient who presents for a follow-up visit. He has a past medical history significant for longstanding persistent atrial fibrillation, currently at coagulated with Xarelto, hyperlipidemia, and hypertension . He notes some pink tinged urine. He is being referred to urology. Will continue wit xarelto until evaluation by urology. He has no exertional chest pain. He denies syncope. He attempts to be active.  ? ?Past Medical and Surgical History  ?Past Medical History ?Past Medical History:  ?Diagnosis Date  ? Acute cataract  ?Right eye  ? Arrhythmia  ? Atrial fibrillation (CMS-HCC)  ? B12 deficiency 07/07/2017  ? Fecal incontinence  ?S/p resection, controlled with Imodium  ? Hyperlipidemia  ? Hypertension  ? Rectal cancer (CMS-HCC)  ?S/p resection, chemo and XRT  ? ?Past Surgical History ?He has a past surgical history that includes Hernia repair (Bilateral); Anterior rectal resection; egd (06/06/2013); Colonoscopy (07/01/2004); Colonoscopy (06/06/2013); Appendectomy; Cataract extraction (Right, 2021); and Colon surgery.  ? ?Medications and Allergies  ?Current Medications ? ?Current Outpatient Medications  ?Medication Sig Dispense Refill  ? cyanocobalamin (VITAMIN B12) 1000 MCG tablet Take 1,000 mcg by mouth once daily  ? multivitamin tablet Take 1 tablet by mouth once daily.  ? rivaroxaban (XARELTO) 20 mg tablet Take 1 tablet (20 mg total) by mouth once daily 30 tablet 11  ? ?No current facility-administered medications for this visit.  ? ?Allergies: Penicillins ? ?Social and Family History  ?Social History ?reports that he has quit smoking.  His smoking use included cigarettes. He smoked an average of 1 pack per day. He has never used smokeless tobacco. He reports current alcohol use of about 2.0 standard drinks per week. He reports that he does not use drugs. ? ?Family History ?Family History  ?Problem Relation Age of Onset  ? Heart failure Mother  ? Hernia Father  ?Strangled  ? Dementia Father  ? Breast cancer Sister  ? Colon cancer Sister  ? Ovarian cancer Sister  ? ?Review of Systems  ?Review of Systems  ?Constitutional: Negative for chills, diaphoresis, fever, malaise/fatigue and weight loss.  ?HENT: Negative for congestion, ear discharge, hearing loss and tinnitus.  ?Eyes: Negative for blurred vision.  ?Respiratory: Negative for cough, hemoptysis, sputum production, shortness of breath and wheezing.  ?Cardiovascular: Negative for chest pain, palpitations, orthopnea, claudication, leg swelling and PND.  ?Gastrointestinal: Negative for abdominal pain, blood in stool, constipation, diarrhea, heartburn, melena, nausea and vomiting.  ?Genitourinary: Negative for dysuria, frequency, hematuria and urgency.  ?Musculoskeletal: Negative for back pain, falls, joint pain and myalgias.  ?Skin: Negative for itching and rash.  ?Neurological: Negative for dizziness, tingling, focal weakness, loss of consciousness, weakness and headaches.  ?Endo/Heme/Allergies: Negative for polydipsia. Does not bruise/bleed easily.  ?Psychiatric/Behavioral: Negative for depression, memory loss and substance abuse. The patient is not nervous/anxious.  ? ? ?Physical Examination  ? ?Vitals: ?BP 120/64  Pulse 77  Resp 16  Ht 193 cm ('6\' 4"'$ )  Wt 81.2 kg (179 lb)  SpO2 98%  BMI 21.79 kg/m?  ?Ht:193 cm ('6\' 4"'$ ) Wt:81.2 kg (179 lb) NID:POEU surface area is 2.09 meters squared. ?Body mass index is 21.79 kg/m?. ? ?Wt  Readings from Last 3 Encounters:  ?06/06/21 81.2 kg (179 lb)  ?03/22/21 83 kg (183 lb)  ?03/06/21 81.6 kg (180 lb)  ? ?BP Readings from Last 3 Encounters:  ?06/06/21 120/64   ?03/22/21 120/72  ?03/06/21 122/80  ?general: Caucasian male in no acute distress ? ?LUNGS ?Breath Sounds: Normal ?Percussion: Normal ? ?CARDIOVASCULAR ?JVP ?CV wave: no ?HJR: no ?Elevation at 90 degrees: None ?Carotid ?Pulse: normal pulsation bilaterally ?Bruit: None ?Apex: apical impulse normal ? ?Auscultation ?Rhythm: atrial fibrillation ?S1: normal ?S2: normal ?Clicks: no ?Rub: no ?Murmurs: 1/6 medium pitched mid systolic rumbling at lower left sternal border  ?Gallop: None ? ?EXTREMITIES ?Clubbing: no ?Edema: trace to 1+ bilateral pedal edema ?Pulses: peripheral pulses symmetrical ?Femoral Bruits: no ?Amputation: no ?SKIN ?Rash: no ?Cyanosis: no ?Embolic phemonenon: no ?Bruising: no ?NEURO ?Alert and Oriented to person, place and time: yes ?Non focal: yes ? ?Assessment and Plan  ? ?86 y.o. male with  ?ICD-10-CM ICD-9-CM  ?1. Chronic a-fib-rate is controlled without av nodal meds. Continue with Xarelto we will continue his current regimen and follow. I48.2 427.31  ?2. Chronic atrial fibrillation the-as per above I48.2 427.31  ?3. Essential hypertension, benign- Stable off of all meds. I10 401.1  ?4. Hyperlipidemia, mixed-continue with low-fat diet E78.2 272.2  ?5. Dizziness and hearing loss-etiology unclear most likely secondary to relative hypotension and bradycardia.  ? ?6. Chest pain-somewhat atypical. We will continue to follow. We will medically manage. If symptoms persist consideration for invasive evaluation could be raised. ? ?7. Hematuria-follow up with urology.  ? ?Return in about 6 months (around 12/04/2021). ? ?These notes generated with voice recognition software. I apologize for typographical errors. ? ?Sydnee Levans, MD ? ? ?Electronically signed by Sydnee Levans, MD at 06/06/2021 11:04 AM EST  ?Plan of Treatment ?- documented as of this encounter ?Plan of Treatment - Upcoming Encounters ?Upcoming Encounters ?Date Type Specialty Care Team Description  ?12/04/2021 Office Visit Cardiology  Fath, Aloha Gell, MD   ?[address]   ?[place], [place] [address]   ?[phone] (Work)   ?[phone] (Fax)      ?03/25/2022 Office Visit Family Medicine Feldpausch, Donzetta Matters., MD   ?[address]   ?[place], [place] [address]   ?[phone] (Work)   ?[phone] (Fax)   ?  ?Lawson Radar, NP   ?[address]   ?[place], [place] [address]   ?[phone] (Work)   ?[phone] (Fax)      ? ?Goals ?- documented as of this encounter ?Goals ?Goal Patient Goal Type Associated Problems Recent Progress Patient-Stated? Author  ?Maintain health/healthy lifestyle   Lifestyle   On track (06/12/2021 11:16 AM EST) No Lin Landsman, CMA  ? ?Visit Diagnoses ?- documented in this encounter ?Visit Diagnoses ?Diagnosis  ?Atrial fibrillation, unspecified type (CMS-HCC) - Primary    ?Essential hypertension, benign    ?Chest pain, atypical    ?Hyperlipidemia, mixed   ?Mixed hyperlipidemia    ? ?Care Teams ?- documented as of this encounter ?Care Teams ?Team Member Relationship Specialty Start Date End Date  ?Lawson Radar, NP   ?[address]   ?[place], [place] [address]   ?[phone] (Work)   ?[phone] (Fax)   PCP - General Family Medicine 01/11/20    ? ?Images ?Patient Demographics ? ?Patient Demographics ?Patient Address Communication Language Race / Ethnicity Marital Status  ?[place] (Home) ?[place], [place] [address] ? ?(Dec. 19, 2016 - ): ?[place] (Home) ?[place],  [address]  [phone] (Home) ?[phone] (Mobile) ?[email or web address] English (Preferred) White / Not Hispanic or Latino Single  ? ?Patient Contacts ? ?Patient Contacts ?Contact Name Contact Address Communication Relationship to Patient  ?Pryor Montes Unknown [phone] (Home) Other, Emergency Contact  ? ?Document Information ? ?Primary Care Provider Other Service Providers Document Coverage Dates  ?Lawson Radar, NP (Aug. 25, 2021August 25, 2021 - Present) ?[phone] (Work) ?[phone] (Fax) ?[facility] ?[place], [place] [address] ?Family Medicine ?[facility] ?[address] ?[place], [place] [address]  Jan. 19, 2023January 19, 2023 - Jan. 23, 2023January 23, 2023  ? ?[employer]

## 2021-08-31 LAB — URINE CULTURE: Culture: NO GROWTH

## 2021-09-06 ENCOUNTER — Ambulatory Visit: Payer: Medicare Other | Admitting: Urgent Care

## 2021-09-06 ENCOUNTER — Ambulatory Visit: Payer: Medicare Other | Admitting: Anesthesiology

## 2021-09-06 ENCOUNTER — Other Ambulatory Visit: Payer: Self-pay

## 2021-09-06 ENCOUNTER — Encounter
Admission: RE | Disposition: A | Discharge status: Home / Self Care | Payer: Self-pay | Source: Ambulatory Visit | Attending: Urology

## 2021-09-06 ENCOUNTER — Encounter: Payer: Self-pay | Admitting: Urology

## 2021-09-06 ENCOUNTER — Ambulatory Visit
Admission: RE | Admit: 2021-09-06 | Discharge: 2021-09-06 | Disposition: A | Discharge status: Home / Self Care | Payer: Medicare Other | Source: Ambulatory Visit | Attending: Urology | Admitting: Urology

## 2021-09-06 DIAGNOSIS — Z923 Personal history of irradiation: Secondary | ICD-10-CM | POA: Insufficient documentation

## 2021-09-06 DIAGNOSIS — Z9221 Personal history of antineoplastic chemotherapy: Secondary | ICD-10-CM | POA: Diagnosis not present

## 2021-09-06 DIAGNOSIS — C674 Malignant neoplasm of posterior wall of bladder: Secondary | ICD-10-CM | POA: Insufficient documentation

## 2021-09-06 DIAGNOSIS — N323 Diverticulum of bladder: Secondary | ICD-10-CM | POA: Insufficient documentation

## 2021-09-06 DIAGNOSIS — I4891 Unspecified atrial fibrillation: Secondary | ICD-10-CM | POA: Diagnosis not present

## 2021-09-06 DIAGNOSIS — D494 Neoplasm of unspecified behavior of bladder: Secondary | ICD-10-CM

## 2021-09-06 DIAGNOSIS — Z87891 Personal history of nicotine dependence: Secondary | ICD-10-CM | POA: Diagnosis not present

## 2021-09-06 DIAGNOSIS — Z85048 Personal history of other malignant neoplasm of rectum, rectosigmoid junction, and anus: Secondary | ICD-10-CM | POA: Diagnosis not present

## 2021-09-06 HISTORY — PX: CYSTOSCOPY WITH BIOPSY: SHX5122

## 2021-09-06 HISTORY — PX: CYSTOSCOPY WITH FULGERATION: SHX6638

## 2021-09-06 HISTORY — PX: BLADDER INSTILLATION: SHX6893

## 2021-09-06 SURGERY — CYSTOSCOPY, WITH BIOPSY
Anesthesia: General

## 2021-09-06 MED ORDER — PHENYLEPHRINE 80 MCG/ML (10ML) SYRINGE FOR IV PUSH (FOR BLOOD PRESSURE SUPPORT)
PREFILLED_SYRINGE | INTRAVENOUS | Status: DC | PRN
  Administered 2021-09-06 (×3): 80 ug via INTRAVENOUS
  Administered 2021-09-06: 160 ug via INTRAVENOUS
  Administered 2021-09-06: 80 ug via INTRAVENOUS

## 2021-09-06 MED ORDER — ONDANSETRON HCL 4 MG/2ML IJ SOLN
INTRAMUSCULAR | Status: DC | PRN
  Administered 2021-09-06: 4 mg via INTRAVENOUS

## 2021-09-06 MED ORDER — ONDANSETRON HCL 4 MG/2ML IJ SOLN
INTRAMUSCULAR | Status: AC
  Filled 2021-09-06: qty 2

## 2021-09-06 MED ORDER — CEFAZOLIN SODIUM-DEXTROSE 2-4 GM/100ML-% IV SOLN
2.0000 g | INTRAVENOUS | Status: AC
  Administered 2021-09-06: 2 g via INTRAVENOUS

## 2021-09-06 MED ORDER — ROCURONIUM BROMIDE 100 MG/10ML IV SOLN
INTRAVENOUS | Status: DC | PRN
Start: 2021-09-06 — End: 2021-09-06
  Administered 2021-09-06: 50 mg via INTRAVENOUS

## 2021-09-06 MED ORDER — PROPOFOL 10 MG/ML IV BOLUS
INTRAVENOUS | Status: DC | PRN
  Administered 2021-09-06: 130 mg via INTRAVENOUS

## 2021-09-06 MED ORDER — DEXAMETHASONE SODIUM PHOSPHATE 10 MG/ML IJ SOLN
INTRAMUSCULAR | Status: DC | PRN
Start: 2021-09-06 — End: 2021-09-06
  Administered 2021-09-06: 5 mg via INTRAVENOUS

## 2021-09-06 MED ORDER — PROPOFOL 10 MG/ML IV BOLUS
INTRAVENOUS | Status: AC
  Filled 2021-09-06: qty 20

## 2021-09-06 MED ORDER — ACETAMINOPHEN 10 MG/ML IV SOLN: 1000.0000 mg | Freq: Once | INTRAVENOUS | Status: DC | PRN

## 2021-09-06 MED ORDER — CHLORHEXIDINE GLUCONATE 0.12 % MT SOLN: 15.0000 mL | Freq: Once | OROMUCOSAL | Status: AC

## 2021-09-06 MED ORDER — LIDOCAINE HCL (PF) 2 % IJ SOLN
INTRAMUSCULAR | Status: AC
  Filled 2021-09-06: qty 5

## 2021-09-06 MED ORDER — OXYCODONE HCL 5 MG PO TABS: 5.0000 mg | ORAL_TABLET | Freq: Once | ORAL | Status: DC | PRN

## 2021-09-06 MED ORDER — SUGAMMADEX SODIUM 200 MG/2ML IV SOLN
INTRAVENOUS | Status: DC | PRN
  Administered 2021-09-06: 200 mg via INTRAVENOUS

## 2021-09-06 MED ORDER — GEMCITABINE CHEMO FOR BLADDER INSTILLATION 2000 MG
2000.0000 mg | Freq: Once | INTRAVENOUS | Status: DC
  Filled 2021-09-06: qty 52.6

## 2021-09-06 MED ORDER — CEFAZOLIN SODIUM-DEXTROSE 2-4 GM/100ML-% IV SOLN
INTRAVENOUS | Status: AC
  Filled 2021-09-06: qty 100

## 2021-09-06 MED ORDER — LACTATED RINGERS IV SOLN: INTRAVENOUS | Status: DC

## 2021-09-06 MED ORDER — FENTANYL CITRATE (PF) 100 MCG/2ML IJ SOLN
INTRAMUSCULAR | Status: AC
  Filled 2021-09-06: qty 2

## 2021-09-06 MED ORDER — STERILE WATER FOR IRRIGATION IR SOLN
Status: DC | PRN
  Administered 2021-09-06: 4500 mL

## 2021-09-06 MED ORDER — OXYCODONE HCL 5 MG/5ML PO SOLN: 5.0000 mg | Freq: Once | ORAL | Status: DC | PRN

## 2021-09-06 MED ORDER — ACETAMINOPHEN 10 MG/ML IV SOLN
INTRAVENOUS | Status: AC
  Filled 2021-09-06: qty 100

## 2021-09-06 MED ORDER — FENTANYL CITRATE (PF) 100 MCG/2ML IJ SOLN: 25.0000 ug | INTRAMUSCULAR | Status: DC | PRN

## 2021-09-06 MED ORDER — GEMCITABINE CHEMO FOR BLADDER INSTILLATION 2000 MG
INTRAVENOUS | Status: DC | PRN
Start: 2021-09-06 — End: 2021-09-06
  Administered 2021-09-06: 2000 mg via INTRAVESICAL

## 2021-09-06 MED ORDER — ACETAMINOPHEN 10 MG/ML IV SOLN
INTRAVENOUS | Status: DC | PRN
  Administered 2021-09-06: 1000 mg via INTRAVENOUS

## 2021-09-06 MED ORDER — CHLORHEXIDINE GLUCONATE 0.12 % MT SOLN
OROMUCOSAL | Status: AC
  Administered 2021-09-06: 15 mL via OROMUCOSAL
  Filled 2021-09-06: qty 15

## 2021-09-06 MED ORDER — FENTANYL CITRATE (PF) 100 MCG/2ML IJ SOLN
INTRAMUSCULAR | Status: DC | PRN
  Administered 2021-09-06 (×2): 50 ug via INTRAVENOUS

## 2021-09-06 MED ORDER — PROMETHAZINE HCL 25 MG/ML IJ SOLN: 6.2500 mg | INTRAMUSCULAR | Status: DC | PRN

## 2021-09-06 MED ORDER — FAMOTIDINE 20 MG PO TABS
ORAL_TABLET | ORAL | Status: AC
  Administered 2021-09-06: 20 mg via ORAL
  Filled 2021-09-06: qty 1

## 2021-09-06 MED ORDER — LACTATED RINGERS IV SOLN: INTRAVENOUS | Status: DC | PRN

## 2021-09-06 MED ORDER — DEXAMETHASONE SODIUM PHOSPHATE 10 MG/ML IJ SOLN
INTRAMUSCULAR | Status: AC
  Filled 2021-09-06: qty 1

## 2021-09-06 MED ORDER — FAMOTIDINE 20 MG PO TABS: 20.0000 mg | ORAL_TABLET | Freq: Once | ORAL | Status: AC

## 2021-09-06 MED ORDER — ORAL CARE MOUTH RINSE
15.0000 mL | Freq: Once | OROMUCOSAL | Status: AC
Start: 2021-09-06 — End: 2021-09-06

## 2021-09-06 MED ORDER — LIDOCAINE HCL (CARDIAC) PF 100 MG/5ML IV SOSY
PREFILLED_SYRINGE | INTRAVENOUS | Status: DC | PRN
  Administered 2021-09-06: 100 mg via INTRAVENOUS

## 2021-09-06 SURGICAL SUPPLY — 26 items
BAG DRAIN CYSTO-URO LG1000N (MISCELLANEOUS) ×2 IMPLANT
BAG URINE DRAIN 2000ML AR STRL (UROLOGICAL SUPPLIES) ×2 IMPLANT
BRUSH SCRUB EZ  4% CHG (MISCELLANEOUS) ×1
BRUSH SCRUB EZ 4% CHG (MISCELLANEOUS) ×1 IMPLANT
CATH FOL 2WAY LX 18X30 (CATHETERS) ×2 IMPLANT
DRSG TELFA 4X3 1S NADH ST (GAUZE/BANDAGES/DRESSINGS) ×2 IMPLANT
ELECT REM PT RETURN 9FT ADLT (ELECTROSURGICAL) ×2
ELECTRODE REM PT RTRN 9FT ADLT (ELECTROSURGICAL) ×1 IMPLANT
GAUZE 4X4 16PLY ~~LOC~~+RFID DBL (SPONGE) ×4 IMPLANT
GLOVE SURG UNDER POLY LF SZ7.5 (GLOVE) ×2 IMPLANT
GOWN STRL REUS W/ TWL LRG LVL3 (GOWN DISPOSABLE) ×1 IMPLANT
GOWN STRL REUS W/ TWL XL LVL3 (GOWN DISPOSABLE) ×1 IMPLANT
GOWN STRL REUS W/TWL LRG LVL3 (GOWN DISPOSABLE) ×1
GOWN STRL REUS W/TWL XL LVL3 (GOWN DISPOSABLE) ×1
GUIDEWIRE STR DUAL SENSOR (WIRE) IMPLANT
IV NS IRRIG 3000ML ARTHROMATIC (IV SOLUTION) ×2 IMPLANT
KIT TURNOVER CYSTO (KITS) ×2 IMPLANT
MANIFOLD NEPTUNE II (INSTRUMENTS) ×2 IMPLANT
PACK CYSTO AR (MISCELLANEOUS) ×2 IMPLANT
SET CYSTO W/LG BORE CLAMP LF (SET/KITS/TRAYS/PACK) ×2 IMPLANT
SURGILUBE 2OZ TUBE FLIPTOP (MISCELLANEOUS) ×2 IMPLANT
SYR TOOMEY IRRIG 70ML (MISCELLANEOUS) ×2
SYRINGE TOOMEY IRRIG 70ML (MISCELLANEOUS) ×1 IMPLANT
WATER STERILE IRR 1000ML POUR (IV SOLUTION) ×2 IMPLANT
WATER STERILE IRR 3000ML UROMA (IV SOLUTION) ×2 IMPLANT
WATER STERILE IRR 500ML POUR (IV SOLUTION) ×2 IMPLANT

## 2021-09-06 NOTE — Anesthesia Procedure Notes (Signed)
Procedure Name: Intubation ?Date/Time: 09/06/2021 8:56 AM ?Performed by: Loletha Grayer, CRNA ?Pre-anesthesia Checklist: Patient identified, Patient being monitored, Timeout performed, Emergency Drugs available and Suction available ?Patient Re-evaluated:Patient Re-evaluated prior to induction ?Oxygen Delivery Method: Circle system utilized ?Preoxygenation: Pre-oxygenation with 100% oxygen ?Induction Type: IV induction ?Ventilation: Mask ventilation without difficulty ?Laryngoscope Size: 3 and Miller ?Grade View: Grade II ?Tube type: Oral ?Tube size: 7.5 mm ?Number of attempts: 1 ?Airway Equipment and Method: Stylet ?Placement Confirmation: ETT inserted through vocal cords under direct vision, positive ETCO2 and breath sounds checked- equal and bilateral ?Secured at: 22 cm ?Tube secured with: Tape ?Dental Injury: Teeth and Oropharynx as per pre-operative assessment  ? ? ? ? ?

## 2021-09-06 NOTE — Op Note (Signed)
Date of procedure: 09/06/21 ? ?Preoperative diagnosis:  ?Bladder tumor, 2.5 cm ? ?Postoperative diagnosis:  ?Same ? ?Procedure: ?Cystoscopy, bladder biopsy and fulguration of 2.5 cm bladder tumor, intravesical instillation of gemcitabine ? ?Surgeon: Nickolas Madrid, MD ? ?Anesthesia: General ? ?Complications: None ? ?Intraoperative findings:  ?Moderate size prostate, ureteral orifices orthotopic bilaterally, moderate trabeculations ?2.5 cm papillary tumor at the right posterior wall adjacent to a diverticulum, removed entirely and fulgurated, excellent hemostasis ? ?EBL: Minimal ? ?Specimens: Bladder tumor ? ?Drains: 42 French two-way Foley ? ?Indication: Sean Anthony is a 86 y.o. patient with gross hematuria found of a 2.5 cm bladder tumor on clinic cystoscopy.  After reviewing the management options for treatment, they elected to proceed with the above surgical procedure(s). We have discussed the potential benefits and risks of the procedure, side effects of the proposed treatment, the likelihood of the patient achieving the goals of the procedure, and any potential problems that might occur during the procedure or recuperation. Informed consent has been obtained. ? ?Description of procedure: ? ?The patient was taken to the operating room and general anesthesia was induced. SCDs were placed for DVT prophylaxis. The patient was placed in the dorsal lithotomy position, prepped and draped in the usual sterile fashion, and preoperative antibiotics(Ancef) were administered. A preoperative time-out was performed.  ? ?A 21 French rigid cystoscope was used to intubate the urethra and a normal-appearing urethra was followed proximally into the bladder.  The prostate was moderate in size.  Thorough cystoscopy showed moderate bladder trabeculations with some small diverticula throughout, ureteral orifices orthotopic bilaterally, and a 2.5 cm papillary tumor just adjacent to a bladder diverticulum at the right posterior  wall. ? ?With the location adjacent to the diverticulum, I opted to use the cold cup biopsy forceps to remove the tumor in multiple bites.  There was no evidence of bladder perforation.  A Bugbee was used to methodically fulgurate the base of the tumor.  The papillary tumor itself measured approximately 2.5 cm, and the area of fulguration was around 3 cm.  There was excellent hemostasis with the bladder decompressed, and no residual lesions. ? ?An 21 French Foley passed easily in the bladder with return of clear fluid.  10 mm were placed in the balloon.  The bladder was irrigated with 500 mL of sterile water and remained clear.  The bladder was drained, and 2 g/50 mL gemcitabine was instilled into the bladder and clamped. ? ?Disposition: Stable to PACU ? ?Plan: ?-Unclamp Foley at 10:15 AM and allow to drain, Foley can be removed prior to discharge, patient must void prior to discharge ?-Follow-up in 2 weeks to discuss pathology results ? ?Nickolas Madrid, MD ? ?

## 2021-09-06 NOTE — Anesthesia Postprocedure Evaluation (Signed)
Anesthesia Post Note ? ?Patient: Sean Anthony ? ?Procedure(s) Performed: CYSTOSCOPY WITH BLADDER BIOPSY ?[facility] ?BLADDER INSTILLATION OF GEMCITABINE ? ?Patient location during evaluation: PACU ?Anesthesia Type: General ?Level of consciousness: awake and alert ?Pain management: pain level controlled ?Vital Signs Assessment: post-procedure vital signs reviewed and stable ?Respiratory status: spontaneous breathing, nonlabored ventilation and respiratory function stable ?Cardiovascular status: blood pressure returned to baseline and stable ?Postop Assessment: no apparent nausea or vomiting ?Anesthetic complications: no ? ? ?No notable events documented. ? ? ?Last Vitals:  ?Vitals:  ? 09/06/21 1047 09/06/21 1132  ?BP: 130/77 131/86  ?Pulse: 79 86  ?Resp: 16 18  ?Temp: (!) 36.1 ?C (!) 36.1 ?C  ?SpO2: 99% 96%  ?  ?Last Pain:  ?Vitals:  ? 09/06/21 1132  ?TempSrc: Temporal  ?PainSc: 0-No pain  ? ? ?  ?  ?  ?  ?  ?  ? ?[place] Ouch ? ? ? ? ?

## 2021-09-06 NOTE — Discharge Instructions (Signed)

## 2021-09-06 NOTE — Anesthesia Preprocedure Evaluation (Addendum)
Anesthesia Evaluation  ? ?Patient awake ? ? ? ?Reviewed: ?Allergy & Precautions, H&P , NPO status , Patient's Chart, lab work & pertinent test results, reviewed documented beta blocker date and time  ? ?History of Anesthesia Complications ?Negative for: history of anesthetic complications ? ?Airway ?Mallampati: I ? ?TM Distance: >3 FB ?Neck ROM: full ? ? ? Dental ? ?(+) Dental Advidsory Given, Caps, Teeth Intact ?Permanent briges:   ?Pulmonary ?neg pulmonary ROS, former smoker,  ?  ?Pulmonary exam normal ? ? ? ? ? ? ? Cardiovascular ?Exercise Tolerance: Good ?(-) hypertension(-) angina(-) Past MI and (-) Cardiac Stents + dysrhythmias Atrial Fibrillation (-) Valvular Problems/Murmurs ? ? ?  ?Neuro/Psych ?negative neurological ROS ? negative psych ROS  ? GI/Hepatic ?negative GI ROS, Neg liver ROS,   ?Endo/Other  ?negative endocrine ROS ? Renal/GU ?negative Renal ROS  ?negative genitourinary ?  ?Musculoskeletal ? ? Abdominal ?  ?Peds ? Hematology ?negative hematology ROS ?(+)   ?Anesthesia Other Findings ?Past Medical History: ?No date: Cancer [facility]) ?    Comment:  rectal cancer ?No date: Dysrhythmia ?    Comment:  atrial fibrillation ?No date: Hard of hearing ?No date: History of blood transfusion ?2001: History of chemotherapy ?    Comment:  radiation too ? ? Reproductive/Obstetrics ?negative OB ROS ? ?  ? ? ? ? ? ? ? ? ? ? ? ? ? ?  ?  ? ? ? ? ? ? ? ?Anesthesia Physical ? ?Anesthesia Plan ? ?ASA: II ? ?Anesthesia Plan: General  ? ?Post-op Pain Management: Minimal or no pain anticipated  ? ?Induction: Intravenous ? ?PONV Risk Score and Plan: 2 and Ondansetron and Dexamethasone ? ?Airway Management Planned: Oral ETT ? ?Additional Equipment:  ? ?Intra-op Plan:  ? ?Post-operative Plan: Extubation in OR ? ?Informed Consent: I have reviewed the patients History and Physical, chart, labs and discussed the procedure including the risks, benefits and alternatives for the proposed anesthesia  with the patient or authorized representative who has indicated his/her understanding and acceptance.  ? ? ? ?Dental advisory given ? ?Plan Discussed with: Anesthesiologist, CRNA and Surgeon ? ?Anesthesia Plan Comments:   ? ? ? ? ? ?Anesthesia Quick Evaluation ? ?

## 2021-09-06 NOTE — H&P (Signed)
? ?  09/06/21 ?8:19 AM  ? ?Star Age ?30-Nov-1935 ?[id number] ? ?CC: Bladder tumor ? ?HPI: ?86 year old male who presented with gross hematuria and was found to have a 2 cm bladder tumor on CT and cystoscopy.  He denies any fevers or urinary symptoms at this time. ? ? ?PMH: ?Past Medical History:  ?Diagnosis Date  ? Cancer [facility]) 2001  ? rectal cancer  ? Dysrhythmia   ? atrial fibrillation  ? Hard of hearing   ? History of blood transfusion   ? History of chemotherapy 2001  ? radiation too  ? ? ?Surgical History: ?Past Surgical History:  ?Procedure Laterality Date  ? CATARACT EXTRACTION W/PHACO Right 04/08/2019  ? Procedure: CATARACT EXTRACTION PHACO AND INTRAOCULAR LENS PLACEMENT ([facility]) RIGHT;  Surgeon: Birder Robson, MD;  Location: [facility] ORS;  Service: Ophthalmology;  Laterality: Right;  [place] 00:56.6 ?CDE 10.34 ?FLUID PACK LOT # [id number] H  ? dental implants    ? EYE SURGERY Left 2020  ? Cataract surgery  ? HERNIA REPAIR  2004  ? LAPAROSCOPIC LOW ANTERIOR RESECTION  2001  ? also had chemo and radiation therapy  ? ? ? ?Family History: ?History reviewed. No pertinent family history. ? ?Social History:  reports that he has quit smoking. He has been exposed to tobacco smoke. He has never used smokeless tobacco. He reports current alcohol use of about 1.0 - 2.0 standard drink per week. He reports that he does not use drugs. ? ?Physical Exam: ?BP (!) 116/57   Pulse 93   Temp 98.3 ?F (36.8 ?C) (Temporal)   Resp 16   Ht '6\' 2"'$  (1.88 m)   Wt 81.2 kg   SpO2 96%   BMI 22.98 kg/m?   ? ?Constitutional:  Alert and oriented, No acute distress. ?Cardiovascular: Regular rate and rhythm ?Respiratory: Clear to auscultation bilaterally ?GI: Abdomen is soft, nontender, nondistended, no abdominal masses ? ? ?Laboratory Data: ?Urine culture 4/14 no growth ? ? ?Assessment & Plan:   ?86 year old male with gross hematuria found to have small bladder tumor on cystoscopy and CT. ? ?We discussed transurethral resection of bladder tumor  (TURBT) and risks and benefits at length. This is typically a 1 to 2-hour procedure done under general anesthesia in the operating room.  A scope is inserted through the urethra and used to resect abnormal tissue within the bladder, which is then sent to the pathologist to determine grade and stage of the tumor.  Risks include bleeding, infection, need for temporary Foley placement, and bladder perforation.  Treatment strategies are based on the type of tumor and depth of invasion.  We briefly reviewed the different treatment pathways for non-muscle invasive and muscle invasive bladder cancer. ? ?Cystoscopy bladder biopsy/fulguration/TURBT, gemcitabine today ? ? ?Nickolas Madrid, MD ?09/06/2021 ? ?[facility] ?[address], [address] ?[place],  [address] ?[phone] ? ?

## 2021-09-06 NOTE — Transfer of Care (Signed)
Immediate Anesthesia Transfer of Care Note ? ?Patient: Sean Anthony ? ?Procedure(s) Performed: CYSTOSCOPY WITH BLADDER BIOPSY ?[facility] ?BLADDER INSTILLATION OF GEMCITABINE ? ?Patient Location: PACU ? ?Anesthesia Type:General ? ?Level of Consciousness: awake ? ?Airway & Oxygen Therapy: Patient Spontanous Breathing and Patient connected to face mask oxygen ? ?Post-op Assessment: Report given to RN and Post -op Vital signs reviewed and stable ? ?Post vital signs: Reviewed and stable ? ?Last Vitals:  ?Vitals Value Taken Time  ?BP 129/79 09/06/21 0924  ?Temp 36.6 ?C 09/06/21 0924  ?Pulse 78 09/06/21 0928  ?Resp 16 09/06/21 0928  ?SpO2 98 % 09/06/21 0928  ?Vitals shown include unvalidated device data. ? ?Last Pain:  ?Vitals:  ? 09/06/21 0754  ?TempSrc: Temporal  ?PainSc: 0-No pain  ?   ? ?  ? ?Complications: No notable events documented. ?

## 2021-09-09 LAB — SURGICAL PATHOLOGY

## 2021-09-24 ENCOUNTER — Encounter: Payer: Self-pay | Admitting: Urology

## 2021-09-24 ENCOUNTER — Ambulatory Visit: Payer: Medicare Other | Admitting: Urology

## 2021-09-24 VITALS — BP 115/62 | HR 39 | Ht 74.0 in | Wt 176.0 lb

## 2021-09-24 DIAGNOSIS — C672 Malignant neoplasm of lateral wall of bladder: Secondary | ICD-10-CM | POA: Diagnosis not present

## 2021-09-24 NOTE — Patient Instructions (Signed)

## 2021-09-24 NOTE — Progress Notes (Signed)
? ?  09/24/2021 ?11:09 AM  ? ?Star Age ?[birth year] ?[id number] ? ?Reason for visit: Follow up TURBT pathology ? ?HPI: ?86 year old male who presented with gross hematuria and was found of a 2.5 cm bladder tumor.  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 ? ?He denies any significant urinary symptoms since surgery, and gross hematuria has resolved.  He does have some neck pain with movement that seems to have improved over the last few days.  He denies any numbness, tingling, or weakness.  I recommended reaching out to his PCP to consider x-rays if his symptoms do not continue to improve. ? ?Regarding his new diagnosis of noninvasive bladder cancer, we discussed options including close surveillance with cystoscopy every 3 months for the first 2 years, or induction BCG and potentially maintenance.  Risk and benefits discussed extensively.  He would like to avoid BCG unless he has a recurrence, which I think is reasonable. ? ?RTC 3 months cystoscopy ? ? ?Billey Co, MD ? ?[facility] ?[address], [address] ?[place], [place] [address] ?[phone] ? ? ?

## 2021-12-24 ENCOUNTER — Ambulatory Visit: Payer: Medicare Other | Admitting: Urology

## 2021-12-24 ENCOUNTER — Encounter: Payer: Self-pay | Admitting: Urology

## 2021-12-24 VITALS — BP 105/64 | HR 91 | Ht 74.0 in | Wt 178.0 lb

## 2021-12-24 DIAGNOSIS — C679 Malignant neoplasm of bladder, unspecified: Secondary | ICD-10-CM | POA: Diagnosis not present

## 2021-12-24 DIAGNOSIS — R310 Gross hematuria: Secondary | ICD-10-CM

## 2021-12-24 DIAGNOSIS — C672 Malignant neoplasm of lateral wall of bladder: Secondary | ICD-10-CM

## 2021-12-24 NOTE — Progress Notes (Signed)
Bladder cancer surveillance note  INDICATION Hx bladder cancer  UROLOGIC HISTORY LEONDRE TAUL is a 86 y.o. male who presented with gross hematuria and found to have a 2.5 cm bladder tumor.  He deferred BCG.  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 Cystoscopy was performed with the a flexible cystoscope. The urethra, bladder neck and entire bladder was visualized in a standard fashion, and bladder mucosa grossly normal throughout. The ureteral orifices were visualized in their normal location and orientation.  No abnormalities on retroflexion.  Cytology deferred.  RTC 3 to 4 months cystoscopy for first 2 years(through 08/2023)  Nickolas Madrid, MD 12/24/2021

## 2022-03-25 ENCOUNTER — Ambulatory Visit: Payer: Medicare Other | Admitting: Urology

## 2022-03-25 ENCOUNTER — Encounter: Payer: Self-pay | Admitting: Urology

## 2022-03-25 VITALS — BP 110/71 | HR 81 | Ht 74.0 in | Wt 182.0 lb

## 2022-03-25 DIAGNOSIS — Z8551 Personal history of malignant neoplasm of bladder: Secondary | ICD-10-CM

## 2022-03-25 DIAGNOSIS — D494 Neoplasm of unspecified behavior of bladder: Secondary | ICD-10-CM

## 2022-03-25 MED ORDER — CEPHALEXIN 250 MG PO CAPS
500.0000 mg | ORAL_CAPSULE | Freq: Once | ORAL | Status: AC
  Administered 2022-03-25: 500 mg via ORAL

## 2022-03-25 NOTE — Progress Notes (Signed)
Bladder cancer surveillance note  INDICATION Hx bladder cancer  UROLOGIC HISTORY Sean Anthony is a 86 y.o. male who presented with gross hematuria and found to have a 2.5 cm bladder tumor.  He deferred BCG.  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 Cystoscopy was performed with the a flexible cystoscope. The urethra, bladder neck and entire bladder was visualized in a standard fashion, and bladder mucosa grossly normal throughout. The ureteral orifices were visualized in their normal location and orientation.  No abnormalities on retroflexion.  Cytology deferred.  RTC 3 to 4 months cystoscopy for first 2 years(through 08/2023)  Nickolas Madrid, MD 03/25/2022

## 2022-07-22 ENCOUNTER — Ambulatory Visit: Payer: Medicare Other | Admitting: Urology

## 2022-07-22 VITALS — BP 111/59 | HR 63 | Ht 74.0 in

## 2022-07-22 DIAGNOSIS — Z8551 Personal history of malignant neoplasm of bladder: Secondary | ICD-10-CM

## 2022-07-22 MED ORDER — SULFAMETHOXAZOLE-TRIMETHOPRIM 800-160 MG PO TABS
1.0000 | ORAL_TABLET | Freq: Once | ORAL | Status: AC
  Administered 2022-07-22: 1 via ORAL

## 2022-07-22 MED ORDER — LIDOCAINE HCL URETHRAL/MUCOSAL 2 % EX GEL
1.0000 | Freq: Once | CUTANEOUS | Status: AC
  Administered 2022-07-22: 1 via URETHRAL

## 2022-07-22 NOTE — Progress Notes (Signed)
Bladder cancer surveillance note  INDICATION Hx bladder cancer  UROLOGIC HISTORY Sean Anthony is a 87 y.o. male who presented with gross hematuria and found to have a 2.5 cm bladder tumor.  He deferred BCG.  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 Cystoscopy was performed with the a flexible cystoscope. The urethra, bladder neck and entire bladder was visualized in a standard fashion, and bladder mucosa grossly normal throughout. The ureteral orifices were visualized in their normal location and orientation.  No abnormalities on retroflexion.  Cytology deferred.  RTC 3 to 4 months cystoscopy for first 2 years(through 08/2023)  Nickolas Madrid, MD 07/22/2022

## 2022-11-21 ENCOUNTER — Other Ambulatory Visit: Payer: Self-pay | Admitting: Internal Medicine

## 2022-11-21 DIAGNOSIS — I4891 Unspecified atrial fibrillation: Secondary | ICD-10-CM

## 2022-11-21 IMAGING — CT CT ABD-PEL WO/W CM
2 of 12 series · 9 of 46 positions shown, 15 images · IV contrast (agent unspecified)
Comparison: Report from abdominal radiographs dated 08/28/2020

CLINICAL DATA: Gross hematuria which occurred while the patient was
taking Xarelto. Remote history rectal cancer with low anterior
resection and prior chemotherapy and radiation therapy.

* Tracking Code: BO *
EXAM:
CT ABDOMEN AND PELVIS WITHOUT AND WITH CONTRAST
TECHNIQUE: Multidetector CT imaging of the abdomen and pelvis was performed
following the standard protocol before and following the bolus
administration of intravenous contrast.

[Series 17: axial delay delay prone 5.00 · axial · delayed · 0.72mm/px · z∈[-1391,-1041]mm · 7 of 94 slices shown, 12 images]
[im 12/94  soft-tissue]
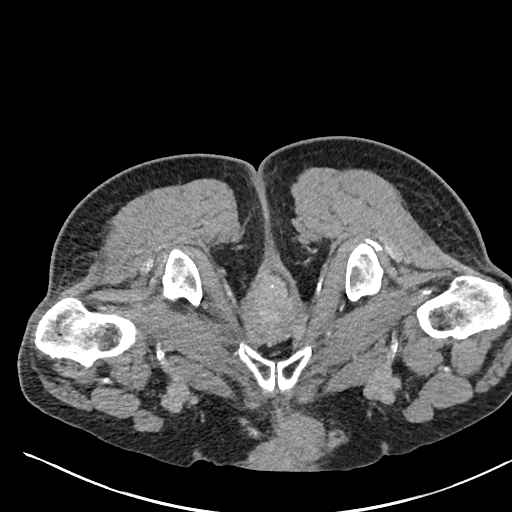
[im 12/94  bone]
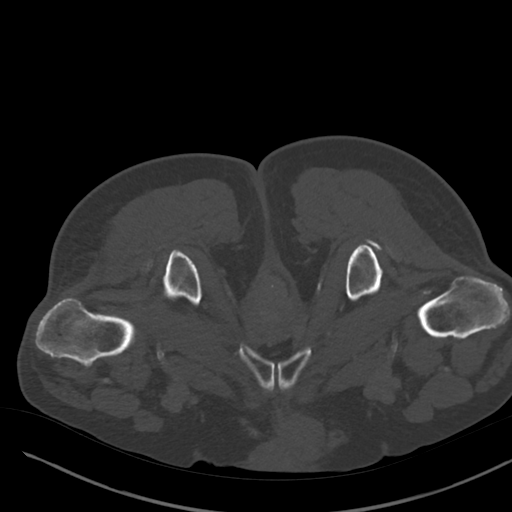
[im 24/94  soft-tissue]
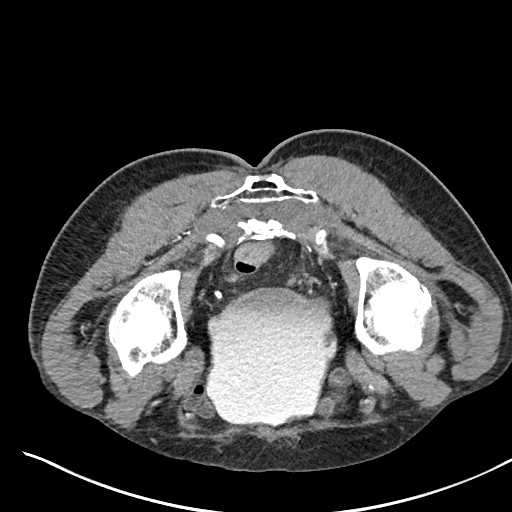
[im 35/94  soft-tissue]
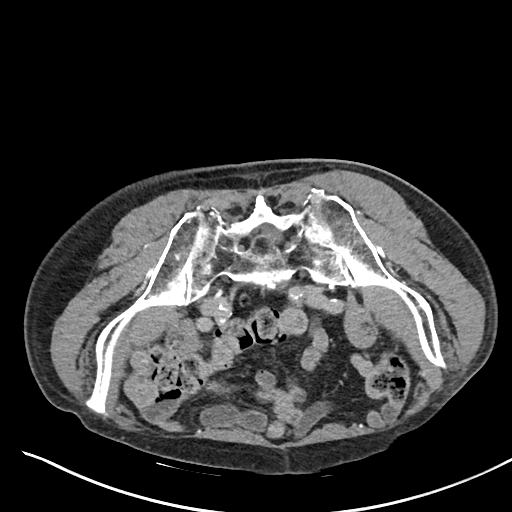
[im 47/94  soft-tissue]
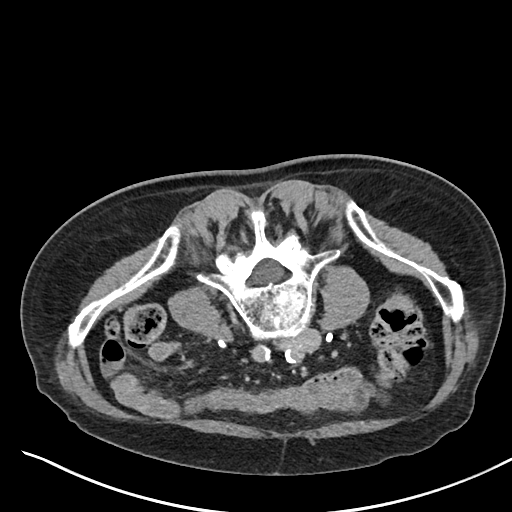
[im 47/94  lung]
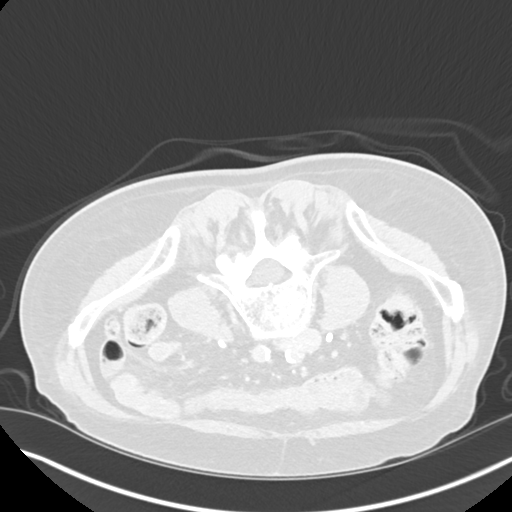
[im 59/94  soft-tissue]
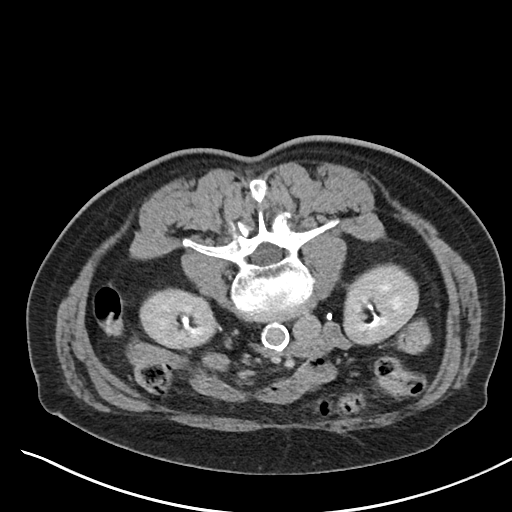
[im 59/94  lung]
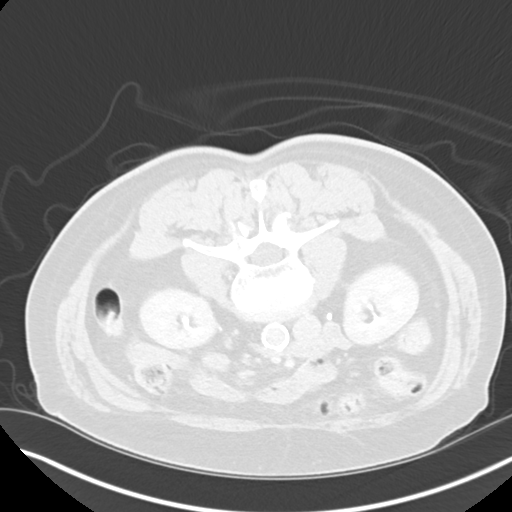
[im 70/94  soft-tissue]
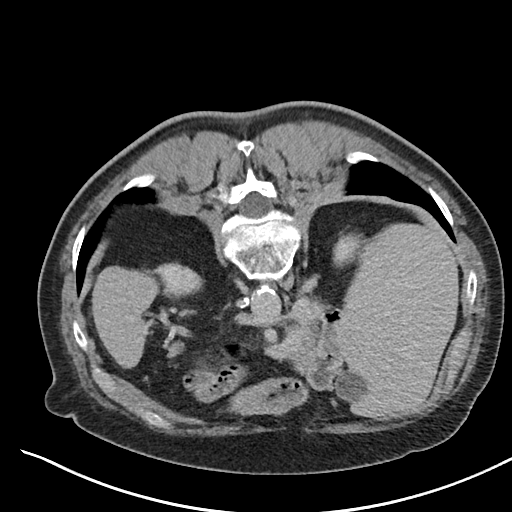
[im 70/94  lung]
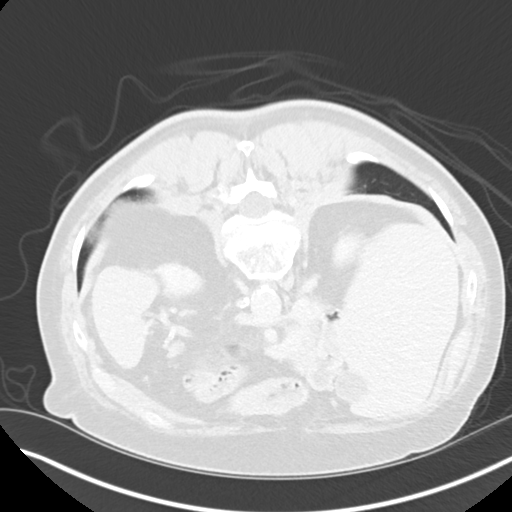
[im 82/94  soft-tissue]
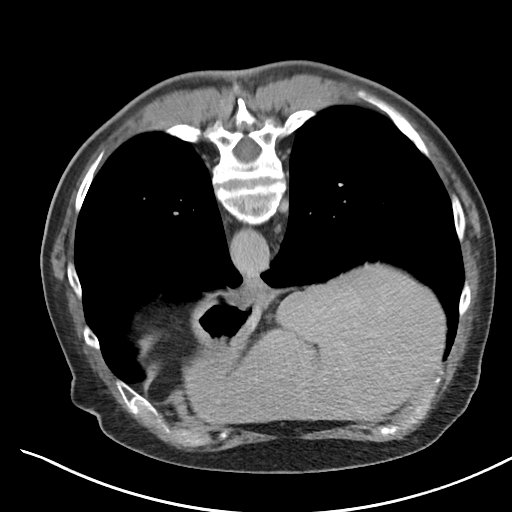
[im 82/94  lung]
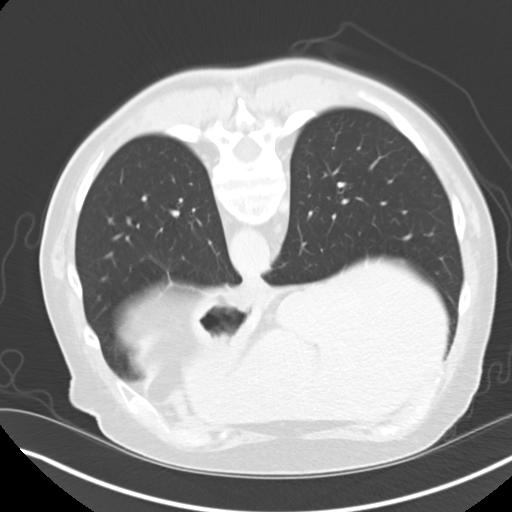

[Series 20: cor delay delay prone 2.00 cor · coronal · delayed · 0.72mm/px · 2 of 167 slices shown, 3 images]
[im 56/167  soft-tissue]
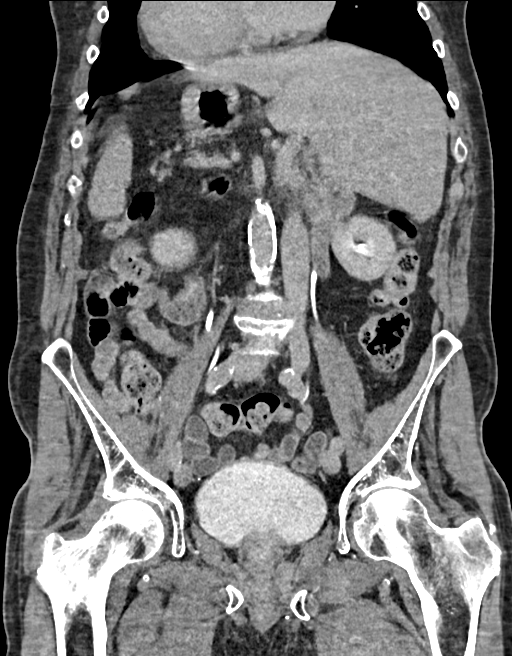
[im 56/167  bone]
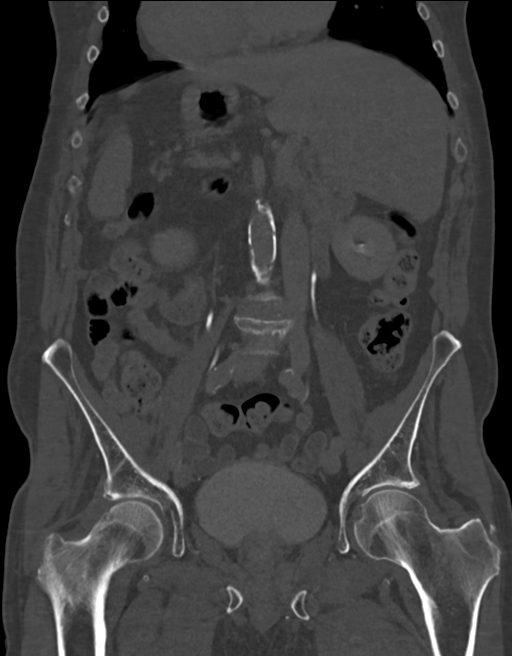
[im 111/167  soft-tissue]
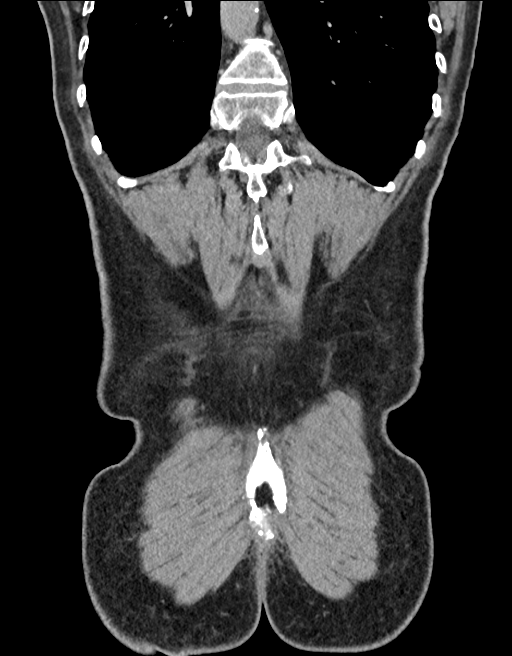

[9 of 46 positions shown; findings below may reference images not displayed]

RADIATION DOSE REDUCTION: This exam was performed according to the
departmental dose-optimization program which includes automated
exposure control, adjustment of the mA and/or kV according to
patient size and/or use of iterative reconstruction technique.

CONTRAST:  125mL OMNIPAQUE IOHEXOL 300 MG/ML  SOLN
FINDINGS: Lower chest: Mild scarring or subsegmental atelectasis in the left
lower lobe. Descending thoracic aortic atherosclerotic calcification
and mild calcification of the visualized part of the mitral valve.

Hepatobiliary: 5 mm hypodense lesion along the base of the caudate
lobe, technically too small to characterize although statistically
likely to be a small benign cyst. Gallbladder unremarkable. The
liver appears otherwise normal.

Pancreas: Unremarkable

Spleen: Unremarkable

Adrenals/Urinary Tract: Both adrenal glands appear normal. Small
cellules in the urinary bladder.

Subtle asymmetric wall thickening in the right side of the urinary
bladder compared to the left shown on image 73 of series 12. There
is also some mild asymmetric enhancement in the right bladder wall
in this vicinity and tracking around a right bladder wall cellule
for example on image 77 of series 12. Although possibly inflammatory
or due to prior radiation therapy, sessile urothelial tumor might
also be considered.

No abnormal renal parenchymal abnormality. No urinary tract calculi
observed.

Stomach/Bowel: Prior low anterior resection with anastomotic staple
line along the lower rectum. Adjacent presacral soft tissue density
and calcification is likely therapy related. Otherwise unremarkable.

Vascular/Lymphatic: Atherosclerosis is present, including aortoiliac
atherosclerotic disease. Notable soft and calcific plaque proximally
in the SMA. No overt occlusion. Today's exam was not a CT angiogram.
Atheromatous plaque is noted proximally in the renal arteries,
high-grade on the left than the right. No pathologic adenopathy
observed.

Reproductive: Calcification along the margins of the transition zone
of the prostate gland.

Other: No supplemental non-categorized findings.

Musculoskeletal: Mild focal sclerosis anteriorly in the right sixth
rib on image 2 series 9, possibly from an old healed fracture.
Chronic appearing superior endplate compression fracture at L5.
Small umbilical hernia contains adipose tissue.
IMPRESSION: 1. Asymmetric wall thickening in the right-sided the urinary bladder
with some asymmetric enhancement. Possibilities include sessile
urothelial tumor, inflammation, or radiation therapy related
findings in the urinary bladder. Cystitis is likely indicated given
the possibility of malignancy.
2. Prior low anterior resection, with likely chronic presacral soft
tissue density and calcification which is most likely therapy
related.
3. No pathologic adenopathy is observed in the pelvis or elsewhere.
Other imaging findings of potential clinical significance: Aortic
Atherosclerosis (6K98H-AE4.4). Systemic atherosclerosis. Chronic
superior endplate compression at L5. Small umbilical hernia
containing adipose tissue.

## 2022-11-24 ENCOUNTER — Other Ambulatory Visit: Payer: Self-pay

## 2022-11-24 DIAGNOSIS — Z8551 Personal history of malignant neoplasm of bladder: Secondary | ICD-10-CM

## 2022-11-25 ENCOUNTER — Ambulatory Visit: Payer: Medicare Other | Admitting: Urology

## 2022-11-25 ENCOUNTER — Encounter: Payer: Self-pay | Admitting: Urology

## 2022-11-25 VITALS — BP 106/62 | HR 56 | Ht 74.0 in | Wt 178.0 lb

## 2022-11-25 DIAGNOSIS — C672 Malignant neoplasm of lateral wall of bladder: Secondary | ICD-10-CM

## 2022-11-25 DIAGNOSIS — Z8551 Personal history of malignant neoplasm of bladder: Secondary | ICD-10-CM

## 2022-11-25 MED ORDER — SULFAMETHOXAZOLE-TRIMETHOPRIM 800-160 MG PO TABS
1.0000 | ORAL_TABLET | Freq: Once | ORAL | Status: AC
  Administered 2022-11-25: 1 via ORAL

## 2022-11-25 NOTE — Progress Notes (Signed)
Bladder cancer surveillance note  INDICATION Hx bladder cancer  UROLOGIC HISTORY Sean Anthony is a 87 y.o. male who presented with gross hematuria in April 2023 and found to have a 2.5 cm bladder tumor.  He deferred BCG.  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 Cystoscopy was performed with the a flexible cystoscope. The urethra, bladder neck and entire bladder was visualized in a standard fashion, and bladder mucosa grossly normal throughout. The ureteral orifices were visualized in their normal location and orientation.  No abnormalities on retroflexion.  Cytology deferred.  RTC 3 to 4 months cystoscopy for first 2 years(through 08/2023)  Legrand Rams, MD 11/25/2022

## 2022-11-27 ENCOUNTER — Ambulatory Visit
Admission: RE | Admit: 2022-11-27 | Discharge: 2022-11-27 | Disposition: A | Discharge status: Home / Self Care | Payer: Self-pay | Source: Ambulatory Visit | Attending: Internal Medicine | Admitting: Internal Medicine

## 2022-11-27 DIAGNOSIS — I4891 Unspecified atrial fibrillation: Secondary | ICD-10-CM | POA: Insufficient documentation

## 2022-12-19 ENCOUNTER — Other Ambulatory Visit: Payer: Self-pay | Admitting: Internal Medicine

## 2022-12-19 DIAGNOSIS — I429 Cardiomyopathy, unspecified: Secondary | ICD-10-CM

## 2022-12-19 DIAGNOSIS — R931 Abnormal findings on diagnostic imaging of heart and coronary circulation: Secondary | ICD-10-CM

## 2022-12-24 ENCOUNTER — Telehealth (HOSPITAL_COMMUNITY): Payer: Self-pay | Admitting: Emergency Medicine

## 2022-12-24 MED ORDER — METOPROLOL TARTRATE 100 MG PO TABS: 100.0000 mg | ORAL_TABLET | Freq: Once | ORAL | 0 refills | Status: DC

## 2022-12-24 NOTE — Telephone Encounter (Signed)
Attempted to call patient regarding upcoming cardiac CT appointment. Left message on voicemail with name and callback number Marchia Bond RN Navigator Cardiac Imaging [facility] [phone] Office [phone] Cell  100mg  metoprolol sent to pharm on file

## 2022-12-25 ENCOUNTER — Ambulatory Visit
Admission: RE | Admit: 2022-12-25 | Discharge: 2022-12-25 | Disposition: A | Discharge status: Home / Self Care | Payer: Medicare Other | Source: Ambulatory Visit | Attending: Internal Medicine | Admitting: Internal Medicine

## 2022-12-25 DIAGNOSIS — I429 Cardiomyopathy, unspecified: Secondary | ICD-10-CM | POA: Diagnosis present

## 2022-12-25 DIAGNOSIS — R931 Abnormal findings on diagnostic imaging of heart and coronary circulation: Secondary | ICD-10-CM | POA: Diagnosis not present

## 2022-12-25 MED ORDER — IOHEXOL 350 MG/ML SOLN
75.0000 mL | Freq: Once | INTRAVENOUS | Status: AC | PRN
  Administered 2022-12-25: 75 mL via INTRAVENOUS

## 2022-12-25 MED ORDER — NITROGLYCERIN 0.4 MG SL SUBL: 0.8000 mg | SUBLINGUAL_TABLET | Freq: Once | SUBLINGUAL | Status: DC

## 2022-12-25 MED ORDER — NITROGLYCERIN 0.4 MG SL SUBL
0.4000 mg | SUBLINGUAL_TABLET | Freq: Once | SUBLINGUAL | Status: AC
  Administered 2022-12-25: 0.4 mg via SUBLINGUAL

## 2022-12-25 MED ORDER — SODIUM CHLORIDE 0.9 % IV BOLUS
150.0000 mL | Freq: Once | INTRAVENOUS | Status: AC
  Administered 2022-12-25: 150 mL via INTRAVENOUS

## 2022-12-25 MED ORDER — METOPROLOL TARTRATE 5 MG/5ML IV SOLN: 5.0000 mg | Freq: Once | INTRAVENOUS | Status: DC

## 2022-12-25 NOTE — Progress Notes (Signed)
Patient arrived for cardiac CT. Patient took premedication Metoprolol 100mg  PO. Patient blood pressure lower and heart rate high for CT. Notified Dr Azucena Cecil consider alternated testing. Patient verbalized understanding of plan of care.

## 2023-01-01 ENCOUNTER — Ambulatory Visit: Admission: RE | Admit: 2023-01-01 | Payer: Medicare Other | Source: Ambulatory Visit

## 2023-03-24 ENCOUNTER — Other Ambulatory Visit: Payer: Self-pay

## 2023-03-24 ENCOUNTER — Ambulatory Visit: Payer: Medicare Other | Admitting: Urology

## 2023-03-24 ENCOUNTER — Other Ambulatory Visit
Admission: RE | Admit: 2023-03-24 | Discharge: 2023-03-24 | Disposition: A | Discharge status: Home / Self Care | Payer: Medicare Other | Attending: Urology | Admitting: Urology

## 2023-03-24 ENCOUNTER — Encounter: Payer: Self-pay | Admitting: Urology

## 2023-03-24 VITALS — BP 114/71 | HR 78 | Ht 74.0 in | Wt 178.0 lb

## 2023-03-24 DIAGNOSIS — Z01812 Encounter for preprocedural laboratory examination: Secondary | ICD-10-CM | POA: Diagnosis not present

## 2023-03-24 DIAGNOSIS — Z8551 Personal history of malignant neoplasm of bladder: Secondary | ICD-10-CM | POA: Insufficient documentation

## 2023-03-24 DIAGNOSIS — N3289 Other specified disorders of bladder: Secondary | ICD-10-CM

## 2023-03-24 DIAGNOSIS — Z79899 Other long term (current) drug therapy: Secondary | ICD-10-CM | POA: Diagnosis not present

## 2023-03-24 DIAGNOSIS — D494 Neoplasm of unspecified behavior of bladder: Secondary | ICD-10-CM

## 2023-03-24 DIAGNOSIS — C675 Malignant neoplasm of bladder neck: Secondary | ICD-10-CM | POA: Insufficient documentation

## 2023-03-24 DIAGNOSIS — Z7901 Long term (current) use of anticoagulants: Secondary | ICD-10-CM | POA: Diagnosis not present

## 2023-03-24 LAB — URINALYSIS, COMPLETE (UACMP) WITH MICROSCOPIC
Bilirubin Urine: NEGATIVE
Glucose, UA: NEGATIVE mg/dL
Ketones, ur: NEGATIVE mg/dL
Leukocytes,Ua: NEGATIVE
Nitrite: NEGATIVE
Protein, ur: NEGATIVE mg/dL
Specific Gravity, Urine: 1.02 (ref 1.005–1.030)
Squamous Epithelial / HPF: NONE SEEN /[HPF] (ref 0–5)
pH: 7 (ref 5.0–8.0)

## 2023-03-24 MED ORDER — LIDOCAINE HCL URETHRAL/MUCOSAL 2 % EX GEL
1.0000 | Freq: Once | CUTANEOUS | Status: AC
  Administered 2023-03-24: 1 via URETHRAL

## 2023-03-24 MED ORDER — SULFAMETHOXAZOLE-TRIMETHOPRIM 800-160 MG PO TABS
1.0000 | ORAL_TABLET | Freq: Once | ORAL | Status: AC
  Administered 2023-03-24: 1 via ORAL

## 2023-03-24 NOTE — Progress Notes (Signed)
Bladder cancer surveillance note  INDICATION Hx bladder cancer  UROLOGIC HISTORY Sean Anthony is a 87 y.o. male who presented with gross hematuria in April 2023 and found to have a 2.5 cm bladder tumor.  He deferred BCG.  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 Cystoscopy was performed with the a flexible cystoscope. The urethra, bladder neck and entire bladder was visualized in a standard fashion. The ureteral orifices were visualized in their normal location and orientation.  On retroflexion there was a small papillary tumor at the bladder neck measuring 1 to 2 cm in size.  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  He deferred second look TURBT and BCG with his age.  Cystoscopy today shows recurrence of papillary tumor at the bladder neck.  We discussed transurethral resection of bladder tumor (TURBT) and risks and benefits at length. This is typically a 1 to 2-hour procedure done under general anesthesia in the operating room.  A scope is inserted through the urethra and used to resect abnormal tissue within the bladder, which is then  sent to the pathologist to determine grade and stage of the tumor.  Risks include bleeding, infection, need for temporary Foley placement, and bladder perforation.  Treatment strategies are based on the type of tumor and depth of invasion.  We briefly reviewed the different treatment pathways for non-muscle invasive and muscle invasive bladder cancer.  CT hematuria(needs to be completed prior to surgery) Schedule TURBT with gemcitabine  Legrand Rams, MD 03/24/2023

## 2023-03-24 NOTE — Progress Notes (Unsigned)
Surgical Physician Order Form [facility]  Dr. Legrand Rams, MD  * Scheduling expectation : Next Available  *Length of Case: 30 minutes  *Clearance needed: no  *Anticoagulation Instructions: Hold all anticoagulants  *Aspirin Instructions: Hold Aspirin and Xarelto  *Post-op visit Date/Instructions: TBD  *Diagnosis: Bladder Tumor  *Procedure:  TURBT <2cm (16109)   Additional orders: Gemcitabine 2000mg  bladder instillation  -Admit type: OUTpatient  -Anesthesia: General  -VTE Prophylaxis Standing Order SCD's       Other:   -Standing Lab Orders Per Anesthesia    Lab other: UA&Urine Culture (sent 11/5)  -Standing Test orders EKG/Chest x-ray per Anesthesia       Test other:   - Medications: Ancef 2 g IV  -Other orders:  N/A

## 2023-03-24 NOTE — Patient Instructions (Signed)
Transurethral Resection of Bladder Tumor  Transurethral resection of a bladder tumor is the removal (resection) of cancerous tissue (tumor) from the inside wall of the bladder. The bladder is the organ that holds urine. The tumor is removed through the tube that carries urine out of the body (urethra). In a transurethral resection, a thin telescope with a light, a tiny camera, and an electric cutting edge (resectoscope) is passed through the urethra. In men, the opening of the urethra is at the end of the penis. In women, it is just above the opening of the vagina. Tell a health care provider about: Any allergies you have. All medicines you are taking, including vitamins, herbs, eye drops, creams, and over-the-counter medicines. Any problems you or family members have had with anesthetic medicines. Any bleeding problems you have. Any surgeries you have had. Any medical conditions you have, including recent urinary tract infections. Whether you are pregnant or may be pregnant. What are the risks? Generally, this is a safe procedure. However, problems may occur, including: Infection. Bleeding. Allergic reactions to medicines. Damage to nearby structures or organs. Difficulty urinating from blockage of the urethra or not being able to urinate (urinary retention). Deep vein thrombosis. This is a blood clot that can develop in your leg. Recurring cancer. What happens before the procedure? When to stop eating and drinking Follow instructions from your health care provider about what you may eat and drink before your procedure. These may include: 8 hours before your procedure Stop eating most foods. Do not eat meat, fried foods, or fatty foods. Eat only light foods, such as toast or crackers. All liquids are okay except energy drinks and alcohol. 6 hours before your procedure Stop eating. Drink only clear liquids, such as water, clear fruit juice, black coffee, plain tea, and sports  drinks. Do not drink energy drinks or alcohol. 2 hours before your procedure Stop drinking all liquids. You may be allowed to take medicines with small sips of water. Medicines Ask your health care provider about: Changing or stopping your regular medicines. This is especially important if you are taking diabetes medicines or blood thinners. Taking medicines such as aspirin and ibuprofen. These medicines can thin your blood. Do not take these medicines unless your health care provider tells you to take them. Taking over-the-counter medicines, vitamins, herbs, and supplements. General instructions If you will be going home right after the procedure, plan to have a responsible adult: Take you home from the hospital or clinic. You will not be allowed to drive. Care for you for the time you are told. Ask your health care provider what steps will be taken to help prevent infection. These steps may include: Washing skin with a germ-killing soap. Taking antibiotic medicine. Do not use any products that contain nicotine or tobacco for at least 4 weeks before the procedure. These products include cigarettes, chewing tobacco, and vaping devices, such as e-cigarettes. If you need help quitting, ask your health care provider. What happens during the procedure? An IV will be inserted into one of your veins. You will be given one or more of the following: A medicine to help you relax (sedative). A medicine that is injected into your spine to numb the area below and slightly above the injection site (spinal anesthetic). A medicine that is injected into an area of your body to numb everything below the injection site (regional anesthetic). A medicine to make you fall asleep (general anesthetic). Your legs will be placed in foot rests (  stirrups) to open your legs and bend your knees. The resectoscope will be passed through your urethra and into your bladder. The part of your bladder with the tumor will be  resected by the cutting edge of the resectoscope. Fluid will be passed to rinse out the cut tissues (irrigation). The resectoscope will then be taken out. A small, thin tube (catheter) will be passed through your urethra and into your bladder. The catheter will drain urine into a bag outside of your body. The procedure may vary among health care providers and hospitals. What happens after the procedure? Your blood pressure, heart rate, breathing rate, and blood oxygen level will be monitored until you leave the hospital or clinic. You may continue to receive fluids and medicines through an IV. You will be given pain medicine to relieve pain. You will have a catheter to drain your urine. The amount of urine will be measured. If you have blood in your urine, your bladder may be rinsed out by passing fluid through your catheter. You will be encouraged to walk as soon as you can. You may have to wear compression stockings. These stockings help to prevent blood clots and reduce swelling in your legs. If you were given a sedative during the procedure, it can affect you for several hours. Do not drive or operate machinery until your health care provider says that it is safe. Summary Transurethral resection of a bladder tumor is the removal (resection) of a cancerous growth (tumor) on the inside wall of the bladder. To do this procedure, your health care provider uses a thin telescope with a light, a tiny camera, and an electric cutting edge (resectoscope) that is guided to your bladder through your urethra. The part of your bladder that is affected by the tumor will be resected by the cutting edge of the resectoscope. A catheter will be passed through your urethra and into your bladder. The catheter will drain urine into a bag outside of your body. If you will be going home right after the procedure, plan to have a responsible adult take you home from the hospital or clinic. You will not be allowed to  drive. This information is not intended to replace advice given to you by your health care provider. Make sure you discuss any questions you have with your health care provider. Document Revised: 05/10/2021 Document Reviewed: 05/10/2021 Elsevier Patient Education  2024 [employer]  Transurethral Resection of Bladder Tumor, Care After The following information offers guidance on how to care for yourself after your procedure. Your health care provider may also give you more specific instructions. If you have problems or questions, contact your health care provider. What can I expect after the procedure? After the procedure, it is common to have: A small amount of blood or small blood clots in your urine for up to 2 weeks. Soreness or mild pain from your catheter. After your catheter is removed, you may have mild soreness, especially when urinating. A need to urinate often. Pain in your lower abdomen. Follow these instructions at home: Medicines  Take over-the-counter and prescription medicines only as told by your health care provider. If you were prescribed an antibiotic medicine, take it as told by your health care provider. Do not stop taking the antibiotic even if you start to feel better. Ask your health care provider if the medicine prescribed to you: Requires you to avoid driving or using machinery. Can cause constipation. You may need to take these actions to prevent  or treat constipation: Drink enough fluid to keep your urine pale yellow. Take over-the-counter or prescription medicines. Eat foods that are high in fiber, such as beans, whole grains, and fresh fruits and vegetables. Limit foods that are high in fat and processed sugars, such as fried or sweet foods. Activity  If you were given a sedative during the procedure, it can affect you for several hours. Do not drive or operate machinery until your health care provider says that it is safe. Rest as told by your health care  provider. Avoid sitting for a long time without moving. Get up to take short walks every 1-2 hours. This is important to improve blood flow and breathing. Ask for help if you feel weak or unsteady. Do not lift anything that is heavier than 10 lb (4.5 kg), or the limit that you are told, until your health care provider says that it is safe. Avoid intense physical activity for as long as told by your health care provider. Do not have sex until your health care provider approves. Return to your normal activities as told by your health care provider. Ask your health care provider what activities are safe for you. General instructions If you have a catheter, follow instructions from your health care provider about caring for your catheter and your drainage bag. Do not drink alcohol for as long as told by your health care provider. This is especially important if you are taking prescription pain medicines. Do not use any products that contain nicotine or tobacco. These products include cigarettes, chewing tobacco, and vaping devices, such as e-cigarettes. If you need help quitting, ask your health care provider. Wear compression stockings as told by your health care provider. These stockings help to prevent blood clots and reduce swelling in your legs. Keep all follow-up visits. This is important. You will need to be followed closely with regular checks of your bladder and urethra (cystoscopies) to make sure that the cancer does not come back. Contact a health care provider if: You have blood in your urine for more than 2 weeks. You become constipated. Signs of constipation may include: Having fewer than three bowel movements in a week. Difficulty having a bowel movement. Stools that are dry, hard, or larger than normal. You have a urinary catheter in place, and you have: Spasms or pain. Problems with your catheter or your catheter is blocked. Your catheter has been taken out but you are unable to  urinate. You have signs of infection, such as: Fever or chills. Cloudy or bad-smelling urine. Get help right away if: You have severe abdominal pain that gets worse or does not improve with medicine. You have a lot of large blood clots in your urine. You develop swelling or pain in your leg. You have difficulty breathing. These symptoms may be an emergency. Get help right away. Call 911. Do not wait to see if the symptoms will go away. Do not drive yourself to the hospital. Summary After your procedure, it is common to have a small amount of blood or small blood clots in your urine, soreness or mild pain from your catheter, and pain in your lower abdomen. Take over-the-counter and prescription medicines only as told by your health care provider. Rest as told by your health care provider. Follow your health care provider's instructions about returning to normal activities. Ask what activities are safe for you. If you have a catheter, follow instructions from your health care provider about caring for your catheter and   your drainage bag. This information is not intended to replace advice given to you by your health care provider. Make sure you discuss any questions you have with your health care provider. Document Revised: 05/10/2021 Document Reviewed: 05/10/2021 Elsevier Patient Education  2024 [employer].

## 2023-03-25 ENCOUNTER — Telehealth: Payer: Self-pay

## 2023-03-25 LAB — URINE CULTURE: Culture: <10000 — AB

## 2023-03-25 NOTE — Progress Notes (Signed)
  Phone Number: [phone] for Surgical Coordinator Fax Number: [phone]  REQUEST FOR SURGICAL CLEARANCE        Date: 03/25/23  Faxed to: Dr. Juliann Pares  Surgeon: Dr. 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 YES []      NO  []   Appointment scheduled for: _______________________   Further recommendations: ____________________________________     Physician Signature:__________________________________   Printed Name: ________________________________________   Date: _________________

## 2023-03-25 NOTE — Telephone Encounter (Signed)
Per Dr. Richardo Hanks, Patient is to be scheduled for Transurethral Resection of Bladder Tumor with Intravesical Instillation of Gemcitabine   Mr. Sean Anthony was contacted and possible surgical dates were discussed, Friday November 15th, 2024 was agreed upon for surgery.     Patient was directed to call [phone] between 1-3pm the day before surgery to find out surgical arrival time.  Instructions were given not to eat or drink from midnight on the night before surgery and have a driver for the day of surgery. On the surgery day patient was instructed to enter through the [facility] entrance of [facility] report the Same Day Surgery desk.   Pre-Admit Testing will be in contact via phone to set up an interview with the anesthesia team to review your history and medications prior to surgery.   Reminder of this information was sent via MyChart to the patient.

## 2023-03-25 NOTE — Progress Notes (Signed)
   [facility] Surgical Posting Form  Surgery Date: Date: 04/03/2023  Surgeon: Dr. 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

## 2023-03-27 ENCOUNTER — Encounter
Admission: RE | Admit: 2023-03-27 | Discharge: 2023-03-27 | Disposition: A | Discharge status: Home / Self Care | Payer: Medicare Other | Source: Ambulatory Visit | Attending: Urology

## 2023-03-27 ENCOUNTER — Other Ambulatory Visit: Payer: Self-pay

## 2023-03-27 ENCOUNTER — Encounter
Admission: RE | Admit: 2023-03-27 | Discharge: 2023-03-27 | Disposition: A | Discharge status: Home / Self Care | Payer: Medicare Other | Source: Ambulatory Visit | Attending: Urology | Admitting: Urology

## 2023-03-27 DIAGNOSIS — Z01818 Encounter for other preprocedural examination: Secondary | ICD-10-CM | POA: Insufficient documentation

## 2023-03-27 DIAGNOSIS — I10 Essential (primary) hypertension: Secondary | ICD-10-CM

## 2023-03-27 DIAGNOSIS — Z01810 Encounter for preprocedural cardiovascular examination: Secondary | ICD-10-CM | POA: Diagnosis not present

## 2023-03-27 HISTORY — DX: Atherosclerotic heart disease of native coronary artery without angina pectoris: I25.10

## 2023-03-27 HISTORY — DX: Abnormal findings on diagnostic imaging of heart and coronary circulation: R93.1

## 2023-03-27 HISTORY — DX: Unspecified atrial fibrillation: I48.91

## 2023-03-27 HISTORY — DX: Cardiac murmur, unspecified: R01.1

## 2023-03-27 HISTORY — DX: Hyperlipidemia, unspecified: E78.5

## 2023-03-27 HISTORY — DX: Depression, unspecified: F32.A

## 2023-03-27 HISTORY — DX: Anxiety disorder, unspecified: F41.9

## 2023-03-27 HISTORY — DX: Cardiomyopathy, unspecified: I42.9

## 2023-03-27 HISTORY — DX: Endocarditis, valve unspecified: I38

## 2023-03-27 HISTORY — DX: Prediabetes: R73.03

## 2023-03-27 LAB — BASIC METABOLIC PANEL
Anion gap: 9 (ref 5–15)
BUN: 20 mg/dL (ref 8–23)
CO2: 26 mmol/L (ref 22–32)
Calcium: 9.2 mg/dL (ref 8.9–10.3)
Chloride: 103 mmol/L (ref 98–111)
Creatinine, Ser: 0.85 mg/dL (ref 0.61–1.24)
GFR, Estimated: >60 mL/min (ref >60)
Glucose, Bld: 83 mg/dL (ref 70–99)
Potassium: 3.8 mmol/L (ref 3.5–5.1)
Sodium: 138 mmol/L (ref 135–145)

## 2023-03-27 NOTE — Patient Instructions (Addendum)
Your procedure is scheduled on: 04/03/23 - Friday Report to the Registration Desk on the 1st floor of the Medical Mall. To find out your arrival time, please call [phone] between 1PM - 3PM on: 04/02/23 - Thursday If your arrival time is 6:00 am, do not arrive before that time as the Medical Mall entrance doors do not open until 6:00 am.  REMEMBER: Instructions that are not followed completely may result in serious medical risk, up to and including death; or upon the discretion of your surgeon and anesthesiologist your surgery may need to be rescheduled.  Do not eat food or drink any liquids after midnight the night before surgery.  No gum chewing or hard candies.  One week prior to surgery: Stop Anti-inflammatories (NSAIDS) such as Advil, Aleve, Ibuprofen, Motrin, Naproxen, Naprosyn and Aspirin based products such as Excedrin, Goody's Powder, BC Powder. You may take Tylenol if needed for pain up until the day of surgery.  Stop ANY OVER THE COUNTER supplements until after surgery.  Hold rivaroxaban (XARELTO) beginning 03/31/23.  ON THE DAY OF SURGERY ONLY TAKE THESE MEDICATIONS WITH SIPS OF WATER:  na   No Alcohol for 24 hours before or after surgery.  No Smoking including e-cigarettes for 24 hours before surgery.  No chewable tobacco products for at least 6 hours before surgery.  No nicotine patches on the day of surgery.  Do not use any "recreational" drugs for at least a week (preferably 2 weeks) before your surgery.  Please be advised that the combination of cocaine and anesthesia may have negative outcomes, up to and including death. If you test positive for cocaine, your surgery will be cancelled.  On the morning of surgery brush your teeth with toothpaste and water, you may rinse your mouth with mouthwash if you wish. Do not swallow any toothpaste or mouthwash.  Use CHG Soap or wipes as directed on instruction sheet.  Do not wear jewelry, make-up, hairpins, clips or  nail polish.  For welded (permanent) jewelry: bracelets, anklets, waist bands, etc.  Please have this removed prior to surgery.  If it is not removed, there is a chance that hospital personnel will need to cut it off on the day of surgery.  Do not wear lotions, powders, or perfumes.   Do not shave body hair from the neck down 48 hours before surgery.  Contact lenses, hearing aids and dentures may not be worn into surgery.  Do not bring valuables to the hospital. [facility] is not responsible for any missing/lost belongings or valuables.   Notify your doctor if there is any change in your medical condition (cold, fever, infection).  Wear comfortable clothing (specific to your surgery type) to the hospital.  After surgery, you can help prevent lung complications by doing breathing exercises.  Take deep breaths and cough every 1-2 hours. Your doctor may order a device called an Incentive Spirometer to help you take deep breaths. When coughing or sneezing, hold a pillow firmly against your incision with both hands. This is called "splinting." Doing this helps protect your incision. It also decreases belly discomfort.  If you are being admitted to the hospital overnight, leave your suitcase in the car. After surgery it may be brought to your room.  In case of increased patient census, it may be necessary for you, the patient, to continue your postoperative care in the Same Day Surgery department.  If you are being discharged the day of surgery, you will not be allowed to drive  home. You will need a responsible individual to drive you home and stay with you for 24 hours after surgery.   If you are taking public transportation, you will need to have a responsible individual with you.  Please call the Pre-admissions Testing Dept. at [phone] if you have any questions about these instructions.  Surgery Visitation Policy:  Patients having surgery or a procedure may have two visitors.   Children under the age of 70 must have an adult with them who is not the patient.  Inpatient Visitation:    Visiting hours are 7 a.m. to 8 p.m. Up to four visitors are allowed at one time in a patient room. The visitors may rotate out with other people during the day.  One visitor age 41 or older may stay with the patient overnight and must be in the room by 8 p.m.     Preparing for Surgery with CHLORHEXIDINE GLUCONATE (CHG) Soap  Chlorhexidine Gluconate (CHG) Soap  o An antiseptic cleaner that kills germs and bonds with the skin to continue killing germs even after washing  o Used for showering the night before surgery and morning of surgery  Before surgery, you can play an important role by reducing the number of germs on your skin.  CHG (Chlorhexidine gluconate) soap is an antiseptic cleanser which kills germs and bonds with the skin to continue killing germs even after washing.  Please do not use if you have an allergy to CHG or antibacterial soaps. If your skin becomes reddened/irritated stop using the CHG.  1. Shower the NIGHT BEFORE SURGERY and the MORNING OF SURGERY with CHG soap.  2. If you choose to wash your hair, wash your hair first as usual with your normal shampoo.  3. After shampooing, rinse your hair and body thoroughly to remove the shampoo.  4. Use CHG as you would any other liquid soap. You can apply CHG directly to the skin and wash gently with a scrungie or a clean washcloth.  5. Apply the CHG soap to your body only from the neck down. Do not use on open wounds or open sores. Avoid contact with your eyes, ears, mouth, and genitals (private parts). Wash face and genitals (private parts) with your normal soap.  6. Wash thoroughly, paying special attention to the area where your surgery will be performed.  7. Thoroughly rinse your body with warm water.  8. Do not shower/wash with your normal soap after using and rinsing off the CHG soap.  9. Pat yourself  dry with a clean towel.  10. Wear clean pajamas to bed the night before surgery.  12. Place clean sheets on your bed the night of your first shower and do not sleep with pets.  13. Shower again with the CHG soap on the day of surgery prior to arriving at the hospital.  14. Do not apply any deodorants/lotions/powders.  15. Please wear clean clothes to the hospital.

## 2023-04-02 ENCOUNTER — Encounter: Payer: Self-pay | Admitting: Urology

## 2023-04-02 NOTE — Progress Notes (Signed)
Perioperative / Anesthesia Services  Pre-Admission Testing Clinical Review / Pre-Operative Anesthesia Consult  Date: 04/02/23  Patient Demographics:  Name: Sean Anthony DOB:   May 19, 1936 MRN:   [id number]  Planned Surgical Procedure(s):    Case: [id number] Date/Time: 04/03/23 1134   Procedures:      TRANSURETHRAL RESECTION OF BLADDER TUMOR (TURBT)     BLADDER INSTILLATION OF GEMCITABINE   Anesthesia type: General   Pre-op diagnosis: Bladder Tumor   Location: [facility] OR ROOM 10 / [facility]   Surgeons: Sondra Come, MD     NOTE: Available PAT nursing documentation and vital signs have been reviewed. Clinical nursing staff has updated patient's PMH/PSHx, current medication list, and drug allergies/intolerances to ensure comprehensive history available to assist in medical decision making as it pertains to the aforementioned surgical procedure and anticipated anesthetic course. Extensive review of available clinical information personally performed. [place] PMH and PSHx updated with any diagnoses/procedures that  may have been inadvertently omitted during his intake with the pre-admission testing department's nursing staff.  Clinical Discussion:  Sean Anthony is a 87 y.o. male who is submitted for pre-surgical anesthesia review and clearance prior to him undergoing the above procedure. Patient is a Former Games developer. Pertinent PMH includes: CAD, cardiomyopathy atrial fibrillation, cardiac murmur, aortic atherosclerosis, HTN, HLD, prediabetes, bladder tumor, remote rectal cancer, umbilical hernia, anxiety.  Patient is followed by cardiology Juliann Pares, MD). He was last seen in the cardiology clinic on 02/11/2023; notes reviewed. At the time of his clinic visit, patient doing well overall from a cardiovascular perspective. Patient denied any chest pain, shortness of breath, PND, orthopnea, palpitations, significant peripheral edema, weakness, fatigue, vertiginous  symptoms, or presyncope/syncope. Patient with a past medical history significant for cardiovascular diagnoses. Documented physical exam was grossly benign, providing no evidence of acute exacerbation and/or decompensation of the patient's known cardiovascular conditions.  Coronary CTA was performed on 11/27/2022 that demonstrated an Agatston coronary artery calcium score of 1759. This placed patient in the 33 percentile for age, sex, and race matched controls. Calcium depositions noted to be isolated mainly in the LAD (1571) and RCA (188) distributions.  Study demonstrates normal coronary origin with RIGHT dominance.  TTE performed on 12/31/2022 revealed a mildly reduced left ventricular systolic function with an EF of 45%.  Left ventricle is mildly enlarged.  There was septal and inferior hypokinesis noted.  GLS -12.3%.  There was moderate biatrial enlargement.  There was trivial to mild pulmonary and aortic valve regurgitation.  Additionally, there was moderate mitral and tricuspid valve regurgitation. All transvalvular gradients were noted to be normal providing no evidence suggestive of valvular stenosis. Aorta normal in size with no evidence of aneurysmal dilatation.  Myocardial perfusion imaging study was performed on 01/21/2023 revealing a mildly reduced left ventricular systolic function with EF of 43%.  There was global hypokinesis.  SPECT images demonstrated no evidence of stress-induced myocardial ischemia or arrhythmia; no scintigraphic evidence of scar.  Study determined to be moderate risk overall.  Patient with an atrial fibrillation diagnosis; CHA2DS2-VASc Score = 4 (age x 2, HTN, vascular disease history). His rate and rhythm are currently being maintained on oral metoprolol succinate. He is chronically anticoagulated using rivaroxaban; reported to be compliant with therapy with no evidence or reports of GI/GU bleeding.  Blood pressure reasonably controlled at 130/76 mmHg without the use of  pharmacological interventions.  He is on rosuvastatin for his HLD diagnosis and further ASCVD prevention.  Patient has a prediabetes diagnosis  that he is managing with diet and lifestyle modification.  At the time of his cardiology visit, his most recent A1c level was 5.8% when checked on 03/25/2022.  Since patient was last seen by his cardiologist, hemoglobin A1c level has been rechecked with minimal worsening to 5.9% when checked on 03/26/2023.  Functional capacity somewhat limited by patient's age and medical comorbidities.  With that said, patient is able to complete all of his ADLs/IADLs without cardiovascular limitation.  Per the [facility], patient is able to complete at least 4 METS of physical activity without experiencing any significant degree of angina/anginal equivalent symptoms.  No changes were made to his medication regimen.  Patient follow-up with outpatient cardiology in 1 year or sooner if needed.  Sean Anthony is scheduled for an TRANSURETHRAL RESECTION OF BLADDER TUMOR (TURBT); BLADDER INSTILLATION OF GEMCITABINE on 04/03/2023 with Dr. Legrand Rams, MD.  Given patient's past medical history significant for cardiovascular diagnoses, presurgical cardiac clearance was sought by the PAT team. Per cardiology, "this patient is optimized for surgery and may proceed with the planned procedural course with a LOW risk of significant perioperative cardiovascular complications".  Again, this patient is on daily oral anticoagulation therapy using a DOAC medication. He has been instructed on recommendations for holding his rivaroxaban for 3 days prior to his procedure with plans to restart as soon as postoperative bleeding risk felt to be minimized by his attending surgeon. The patient has been instructed that his last dose of his rivaroxaban should be on 03/30/2023.  Patient denies previous perioperative complications with anesthesia in the past. In review of the available records, it is noted that  patient underwent a general anesthetic course here at [facility] ([facility] in 08/2021 without documented complications.      03/24/2023    8:27 AM 12/25/2022    2:41 PM 12/25/2022    2:30 PM  Vitals with BMI  Height 6\' 2"     Weight 178 lbs    BMI 22.84    Systolic 114 105 97  Diastolic 71 78 58  Pulse 78 76 77    Providers/Specialists:   NOTE: Primary physician provider listed below. Patient may have been seen by APP or partner within same practice.   PROVIDER ROLE / SPECIALTY LAST Lise Auer, MD Urology (Surgeon) 03/24/2023  Rolm Gala, MD Primary Care Provider 03/26/2023  Rudean Hitt, MD Cardiology 02/11/2023   Allergies:  Penicillins  Current Home Medications:   No current facility-administered medications for this encounter.    metoprolol tartrate (LOPRESSOR) 100 MG tablet   Multiple Vitamins-Minerals (CENTRUM SILVER 50+MEN PO)   rivaroxaban (XARELTO) 20 MG TABS tablet   rosuvastatin (CRESTOR) 5 MG tablet   vitamin B-12 (CYANOCOBALAMIN) 1000 MCG tablet   History:   Past Medical History:  Diagnosis Date   Anxiety    Aortic atherosclerosis (HCC)    Atrial fibrillation (HCC)    a.) CHA2DS2-VASc = 4 (age x2, HTN, vascular disease history) as of 04/02/2023; b.) cardiac rate/rhythm maintained on oral metoprolol succinate; chronically anticoagulated using rivaroxaban   B12 deficiency    Bladder tumor    CAD (coronary artery disease)    a.) cCTA 11/27/2022: Ca2+ 1759 (76th %ile) mainly in LAD and RCA distributions; b.) MV 11/15/2020: mixed inf defext c/w ishcemia; c.) MV 01/21/2023: no ishcemia.   Cardiomyopathy, unspecified type (HCC)    a.) TTE 11/15/2020: EF 45% sep/inf HK, mod BAE, mod AR/MR/TR, mild PR, RVSP 32.7; b.) MV 11/15/2020:  EF 48%; c.) TTE 12/31/2022: EF 45%, sep/inf HK, mod BAE, mild AR, mod MR/TR, triv PR, RVSP 25   Depression    Endocarditis    Hard of hearing    Heart murmur    History of blood  transfusion    History of right cataract extraction 2021   HLD (hyperlipidemia)    HTN (hypertension)    On rivaroxaban therapy    Pre-diabetes    Rectal cancer (HCC) 2001   a.) s/p anterior surgical resection + systemic chemotherapy + XRT   Umbilical hernia    Past Surgical History:  Procedure Laterality Date   BLADDER INSTILLATION N/A 09/06/2021   Procedure: BLADDER INSTILLATION OF GEMCITABINE;  Surgeon: Sondra Come, MD;  Location: [facility] ORS;  Service: Urology;  Laterality: N/A;   CATARACT EXTRACTION W/PHACO Right 04/08/2019   Procedure: CATARACT EXTRACTION PHACO AND INTRAOCULAR LENS PLACEMENT (IOC) RIGHT;  Surgeon: Galen Manila, MD;  Location: [facility] ORS;  Service: Ophthalmology;  Laterality: Right;  [place] 00:56.6 CDE 10.34 FLUID PACK LOT # [id number]   CYSTOSCOPY WITH BIOPSY N/A 09/06/2021   Procedure: CYSTOSCOPY WITH BLADDER BIOPSY;  Surgeon: Sondra Come, MD;  Location: [facility] ORS;  Service: Urology;  Laterality: N/A;   CYSTOSCOPY WITH FULGERATION N/A 09/06/2021   Procedure: CYSTOSCOPY WITH FULGERATION;  Surgeon: Sondra Come, MD;  Location: [facility] ORS;  Service: Urology;  Laterality: N/A;   dental implants     EYE SURGERY Left 2020   Cataract surgery   HERNIA REPAIR  2004   LAPAROSCOPIC LOW ANTERIOR RESECTION  2001   also had chemo and radiation therapy   No family history on file. Social History   Tobacco Use   Smoking status: Former    Passive exposure: Past   Smokeless tobacco: Never  Vaping Use   Vaping status: Never Used  Substance Use Topics   Alcohol use: Yes    Alcohol/week: 1.0 - 2.0 standard drink of alcohol    Types: 1 - 2 Cans of beer per week   Drug use: Never    Pertinent Clinical Results:  LABS:   Component Ref Range & Units 03/26/2023  WBC (White Blood Cell Count) 4.1 - 10.2 10^3/uL 4.2  RBC (Red Blood Cell Count) 4.69 - 6.13 10^6/uL 4.33 Low   Hemoglobin 14.1 - 18.1 gm/dL 32.4  Hematocrit 40.1 - 52.0 % 42.3  MCV (Mean Corpuscular  Volume) 80.0 - 100.0 fl 97.7  MCH (Mean Corpuscular Hemoglobin) 27.0 - 31.2 pg 32.8 High   MCHC (Mean Corpuscular Hemoglobin Concentration) 32.0 - 36.0 gm/dL 02.7  Platelet Count 253 - 450 10^3/uL 167  RDW-CV (Red Cell Distribution Width) 11.6 - 14.8 % 12.7  MPV (Mean Platelet Volume) 9.4 - 12.4 fl 11  Neutrophils 1.50 - 7.80 10^3/uL 2.3  Lymphocytes 1.00 - 3.60 10^3/uL 1.5  Mixed Count 0.10 - 0.90 10^3/uL 0.4  Neutrophil % 32.0 - 70.0 % 53.2  Lymphocyte % 10.0 - 50.0 % 36.5  Mixed % 3.0 - 14.4 % 10.3  Resulting Agency [facility]  Specimen Collected: 03/26/23 14:55   Performed by: [facility] Last Resulted: 03/26/23 16:08  Received From: [facility]  Result Received: 03/29/23 13:12   Component Ref Range & Units 03/26/2023  Glucose 70 - 110 mg/dL 88  Sodium 664 - 403 mmol/L 140  Potassium 3.6 - 5.1 mmol/L 4.4  Chloride 97 - 109 mmol/L 105  Carbon Dioxide (CO2) 22.0 - 32.0 mmol/L 25.3  Urea Nitrogen (  BUN) 7 - 25 mg/dL 16  Creatinine 0.7 - 1.3 mg/dL 1  Glomerular Filtration Rate (eGFR) >60 mL/min/1.73sq m 74  Calcium 8.7 - 10.3 mg/dL 9.5  AST 8 - 39 U/L 31  ALT 6 - 57 U/L 22  Alk Phos (alkaline Phosphatase) 34 - 104 U/L 83  Albumin 3.5 - 4.8 g/dL 4.5  Bilirubin, Total 0.3 - 1.2 mg/dL 0.5  Protein, Total 6.1 - 7.9 g/dL 7.1  A/G Ratio 1.0 - 5.0 gm/dL 1.7  Resulting Agency [facility]  Specimen Collected: 03/26/23 14:55   Performed by: [facility] Last Resulted: 03/27/23 14:36  Received From: [facility]  Result Received: 03/29/23 13:12   Component Ref Range & Units 03/26/2023  Hemoglobin A1C 4.2 - 5.6 % 5.9 High   Average Blood Glucose (Calc) mg/dL 161  Resulting Agency [facility]  Normal Range:    4.2 - 5.6% Increased Risk:  5.7 - 6.4% Diabetes:        >= 6.5% Glycemic Control for adults with diabetes:  <7%   Specimen  Collected: 03/26/23 14:55   Performed by: [facility] Last Resulted: 03/27/23 14:17  Received From: [facility]  Result Received: 03/29/23 13:12    ECG: Date: 03/27/2023 Time ECG obtained: 1327 PM Rate: 89 bpm Rhythm:  Atrial fibrillation with PVCs; LAFB Axis (leads I and aVF): Left Intervals: QRS 92 ms. QTc 467 ms. ST segment and T wave changes: No evidence of acute ST segment elevation or depression.   Comparison: Similar to previous tracing obtained on 08/30/2021   IMAGING / PROCEDURES: MYOCARDIAL PERFUSION IMAGING STUDY (LEXISCAN) performed on 01/24/2023 Mildly reduced left ventricular systolic function with a normal LVEF of 43% Normal myocardial thickening and wall motion Left ventricular cavity size normal SPECT images demonstrate homogenous tracer distribution throughout the myocardium No evidence of stress-induced myocardial ischemia or arrhythmia Normal low risk study  TRANSTHORACIC ECHOCARDIOGRAM performed on 12/31/2022 Mildly reduced left ventricular systolic function with an EF of 45% Left ventricle mildly enlarged Septal and inferior hypokinesis GLS -12.3% Moderate biatrial enlargement Trivial PR Mild AR Moderate MR and TR RVSP = 25.0 mmHg Normal gradients; no valvular stenosis No pericardial effusion  CT CARDIAC SCORING performed on 11/27/2022 Coronary calcium score of 1759. This was 76th percentile for age and sex matched control. CAC >300 in LAD, RCA. CAC-DRS A3/N2 Consider aspirin and statin if no contraindication. Consider cardiology consultation. Continue heart healthy lifestyle and risk factor modification.  CT HEMATURIA WORKUP performed on 08/20/2021  Asymmetric wall thickening in the right-sided the urinary bladder with some asymmetric enhancement. Possibilities include sessile urothelial tumor, inflammation, or radiation therapy related findings in the urinary bladder. Cystitis is likely indicated given the  possibility of malignancy. Prior low anterior resection, with likely chronic presacral soft tissue density and calcification which is most likely therapy related. No pathologic adenopathy is observed in the pelvis or elsewhere. Aortic atherosclerosis Systemic atherosclerosis.  Chronic superior endplate compression at L5.  Small umbilical hernia containing adipose tissue.  Impression and Plan:  Sean Anthony has been referred for pre-anesthesia review and clearance prior to him undergoing the planned anesthetic and procedural courses. Available labs, pertinent testing, and imaging results were personally reviewed by me in preparation for upcoming operative/procedural course. [facility] medical record has been updated following extensive record review and patient interview with PAT staff.   This patient has been appropriately cleared by cardiology with an overall  LOW risk of experiencing significant perioperative cardiovascular complications. Based on clinical review performed today (04/02/23), barring any significant acute changes in the patient's overall condition, it is anticipated that he will be able to proceed with the planned surgical intervention. Any acute changes in clinical condition may necessitate his procedure being postponed and/or cancelled. Patient will meet with anesthesia team (MD and/or CRNA) on the day of his procedure for preoperative evaluation/assessment. Questions regarding anesthetic course will be fielded at that time.   Pre-surgical instructions were reviewed with the patient during his PAT appointment, and questions were fielded to satisfaction by PAT clinical staff. He has been instructed on which medications that he will need to hold prior to surgery, as well as the ones that have been deemed safe/appropriate to take on the day of his procedure. As part of the general education provided by PAT, patient made aware both verbally and in writing, that he would need to  abstain from the use of any illegal substances during his perioperative course.  He was advised that failure to follow the provided instructions could necessitate case cancellation or result in serious perioperative complications up to and including death. Patient encouraged to contact PAT and/or his surgeon's office to discuss any questions or concerns that may arise prior to surgery; verbalized understanding.   Quentin Mulling, MSN, APRN, FNP-C, CEN [facility]  Perioperative Services Nurse Practitioner Phone: [phone] Fax: [phone] 04/02/23 2:31 PM  NOTE: This note has been prepared using Dragon dictation software. Despite my best ability to proofread, there is always the potential that unintentional transcriptional errors may still occur from this process.

## 2023-04-03 ENCOUNTER — Ambulatory Visit: Payer: Medicare Other | Admitting: Urgent Care

## 2023-04-03 ENCOUNTER — Encounter
Admission: RE | Disposition: A | Discharge status: Home / Self Care | Payer: Self-pay | Source: Home / Self Care | Attending: Urology

## 2023-04-03 ENCOUNTER — Encounter: Payer: Self-pay | Admitting: Urology

## 2023-04-03 ENCOUNTER — Ambulatory Visit
Admission: RE | Admit: 2023-04-03 | Discharge: 2023-04-03 | Disposition: A | Discharge status: Home / Self Care | Payer: Medicare Other | Attending: Urology | Admitting: Urology

## 2023-04-03 ENCOUNTER — Other Ambulatory Visit: Payer: Self-pay

## 2023-04-03 ENCOUNTER — Ambulatory Visit: Payer: Medicare Other

## 2023-04-03 DIAGNOSIS — Z8551 Personal history of malignant neoplasm of bladder: Secondary | ICD-10-CM | POA: Insufficient documentation

## 2023-04-03 DIAGNOSIS — I4891 Unspecified atrial fibrillation: Secondary | ICD-10-CM | POA: Insufficient documentation

## 2023-04-03 DIAGNOSIS — I2510 Atherosclerotic heart disease of native coronary artery without angina pectoris: Secondary | ICD-10-CM | POA: Insufficient documentation

## 2023-04-03 DIAGNOSIS — I110 Hypertensive heart disease with heart failure: Secondary | ICD-10-CM | POA: Insufficient documentation

## 2023-04-03 DIAGNOSIS — I509 Heart failure, unspecified: Secondary | ICD-10-CM | POA: Diagnosis not present

## 2023-04-03 DIAGNOSIS — Z87891 Personal history of nicotine dependence: Secondary | ICD-10-CM | POA: Insufficient documentation

## 2023-04-03 DIAGNOSIS — D303 Benign neoplasm of bladder: Secondary | ICD-10-CM | POA: Insufficient documentation

## 2023-04-03 DIAGNOSIS — D494 Neoplasm of unspecified behavior of bladder: Secondary | ICD-10-CM

## 2023-04-03 HISTORY — PX: BLADDER INSTILLATION: SHX6893

## 2023-04-03 HISTORY — DX: Unspecified atrial fibrillation: I48.91

## 2023-04-03 HISTORY — PX: TRANSURETHRAL RESECTION OF BLADDER TUMOR: SHX2575

## 2023-04-03 HISTORY — DX: Umbilical hernia without obstruction or gangrene: K42.9

## 2023-04-03 HISTORY — PX: CYSTOSCOPY W/ RETROGRADES: SHX1426

## 2023-04-03 HISTORY — DX: Essential (primary) hypertension: I10

## 2023-04-03 HISTORY — DX: Deficiency of other specified B group vitamins: E53.8

## 2023-04-03 HISTORY — DX: Neoplasm of unspecified behavior of bladder: D49.4

## 2023-04-03 HISTORY — DX: Long term (current) use of anticoagulants: Z79.01

## 2023-04-03 HISTORY — DX: Endocarditis, valve unspecified: I38

## 2023-04-03 HISTORY — DX: Atherosclerotic heart disease of native coronary artery without angina pectoris: I25.10

## 2023-04-03 HISTORY — DX: Atherosclerosis of aorta: I70.0

## 2023-04-03 SURGERY — TURBT (TRANSURETHRAL RESECTION OF BLADDER TUMOR)
Anesthesia: General

## 2023-04-03 MED ORDER — GEMCITABINE CHEMO FOR BLADDER INSTILLATION 2000 MG
2000.0000 mg | Freq: Once | INTRAVENOUS | Status: AC
  Administered 2023-04-03: 2000 mg via INTRAVESICAL
  Filled 2023-04-03: qty 52.6

## 2023-04-03 MED ORDER — SUGAMMADEX SODIUM 200 MG/2ML IV SOLN
INTRAVENOUS | Status: DC | PRN
  Administered 2023-04-03: 200 mg via INTRAVENOUS

## 2023-04-03 MED ORDER — PROPOFOL 10 MG/ML IV BOLUS
INTRAVENOUS | Status: DC | PRN
  Administered 2023-04-03: 20 mg via INTRAVENOUS
  Administered 2023-04-03: 80 mg via INTRAVENOUS

## 2023-04-03 MED ORDER — CEFAZOLIN SODIUM-DEXTROSE 2-4 GM/100ML-% IV SOLN
2.0000 g | INTRAVENOUS | Status: AC
  Administered 2023-04-03: 2 g via INTRAVENOUS

## 2023-04-03 MED ORDER — ROCURONIUM BROMIDE 10 MG/ML (PF) SYRINGE
PREFILLED_SYRINGE | INTRAVENOUS | Status: DC | PRN
  Administered 2023-04-03: 50 mg via INTRAVENOUS
  Administered 2023-04-03: 30 mg via INTRAVENOUS

## 2023-04-03 MED ORDER — ACETAMINOPHEN 10 MG/ML IV SOLN: 1000.0000 mg | Freq: Once | INTRAVENOUS | Status: DC | PRN

## 2023-04-03 MED ORDER — FENTANYL CITRATE (PF) 100 MCG/2ML IJ SOLN
INTRAMUSCULAR | Status: AC
  Filled 2023-04-03: qty 2

## 2023-04-03 MED ORDER — FENTANYL CITRATE (PF) 100 MCG/2ML IJ SOLN
INTRAMUSCULAR | Status: DC | PRN
  Administered 2023-04-03 (×2): 50 ug via INTRAVENOUS

## 2023-04-03 MED ORDER — ESMOLOL HCL 100 MG/10ML IV SOLN
INTRAVENOUS | Status: DC | PRN
  Administered 2023-04-03: 30 mg via INTRAVENOUS
  Administered 2023-04-03: 20 mg via INTRAVENOUS

## 2023-04-03 MED ORDER — IOHEXOL 180 MG/ML  SOLN
INTRAMUSCULAR | Status: DC | PRN
  Administered 2023-04-03: 10 mL

## 2023-04-03 MED ORDER — DROPERIDOL 2.5 MG/ML IJ SOLN: 0.6250 mg | Freq: Once | INTRAMUSCULAR | Status: DC | PRN

## 2023-04-03 MED ORDER — LIDOCAINE HCL (CARDIAC) PF 100 MG/5ML IV SOSY
PREFILLED_SYRINGE | INTRAVENOUS | Status: DC | PRN
  Administered 2023-04-03: 100 mg via INTRAVENOUS

## 2023-04-03 MED ORDER — DEXAMETHASONE SODIUM PHOSPHATE 10 MG/ML IJ SOLN
INTRAMUSCULAR | Status: DC | PRN
Start: 2023-04-03 — End: 2023-04-03
  Administered 2023-04-03: 10 mg via INTRAVENOUS

## 2023-04-03 MED ORDER — CHLORHEXIDINE GLUCONATE 0.12 % MT SOLN: 15.0000 mL | Freq: Once | OROMUCOSAL | Status: DC

## 2023-04-03 MED ORDER — FENTANYL CITRATE (PF) 100 MCG/2ML IJ SOLN: 25.0000 ug | INTRAMUSCULAR | Status: DC | PRN

## 2023-04-03 MED ORDER — ORAL CARE MOUTH RINSE: 15.0000 mL | Freq: Once | OROMUCOSAL | Status: DC

## 2023-04-03 MED ORDER — LACTATED RINGERS IV SOLN: INTRAVENOUS | Status: DC

## 2023-04-03 MED ORDER — OXYCODONE HCL 5 MG/5ML PO SOLN
5.0000 mg | Freq: Once | ORAL | Status: DC | PRN
Start: 2023-04-03 — End: 2023-04-03

## 2023-04-03 MED ORDER — CHLORHEXIDINE GLUCONATE 0.12 % MT SOLN
OROMUCOSAL | Status: AC
Start: 2023-04-03 — End: ?
  Filled 2023-04-03: qty 15

## 2023-04-03 MED ORDER — ONDANSETRON HCL 4 MG/2ML IJ SOLN
INTRAMUSCULAR | Status: DC | PRN
  Administered 2023-04-03: 4 mg via INTRAVENOUS

## 2023-04-03 MED ORDER — OXYCODONE HCL 5 MG PO TABS: 5.0000 mg | ORAL_TABLET | Freq: Once | ORAL | Status: DC | PRN

## 2023-04-03 MED ORDER — SODIUM CHLORIDE 0.9 % IR SOLN
Status: DC | PRN
  Administered 2023-04-03: 3000 mL via INTRAVESICAL

## 2023-04-03 MED ORDER — CEFAZOLIN SODIUM-DEXTROSE 2-4 GM/100ML-% IV SOLN
INTRAVENOUS | Status: AC
  Filled 2023-04-03: qty 100

## 2023-04-03 SURGICAL SUPPLY — 22 items
BAG DRAIN SIEMENS DORNER NS (MISCELLANEOUS) ×2 IMPLANT
BAG URINE DRAIN 2000ML AR STRL (UROLOGICAL SUPPLIES) ×2 IMPLANT
BRUSH SCRUB EZ 4% CHG (MISCELLANEOUS) ×2 IMPLANT
CATH FOL 2WAY LX 18X30 (CATHETERS) ×2 IMPLANT
DRAPE UTILITY 15X26 TOWEL STRL (DRAPES) ×2 IMPLANT
DRSG TELFA 3X4 N-ADH STERILE (GAUZE/BANDAGES/DRESSINGS) ×2 IMPLANT
ELECT LOOP 22F BIPOLAR SML (ELECTROSURGICAL) ×2
ELECTRODE LOOP 22F BIPOLAR SML (ELECTROSURGICAL) IMPLANT
GLOVE BIOGEL PI IND STRL 7.5 (GLOVE) ×2 IMPLANT
GOWN STRL REUS W/ TWL LRG LVL3 (GOWN DISPOSABLE) ×2 IMPLANT
GOWN STRL REUS W/ TWL XL LVL3 (GOWN DISPOSABLE) ×2 IMPLANT
GOWN STRL REUS W/TWL LRG LVL3 (GOWN DISPOSABLE) ×2
GOWN STRL REUS W/TWL XL LVL3 (GOWN DISPOSABLE) ×2
IV NS IRRIG 3000ML ARTHROMATIC (IV SOLUTION) ×4 IMPLANT
KIT TURNOVER CYSTO (KITS) ×2 IMPLANT
NDL SAFETY ECLIPSE 18X1.5 (NEEDLE) ×2 IMPLANT
PACK CYSTO AR (MISCELLANEOUS) ×2 IMPLANT
SET IRRIG Y TYPE TUR BLADDER L (SET/KITS/TRAYS/PACK) ×2 IMPLANT
SURGILUBE 2OZ TUBE FLIPTOP (MISCELLANEOUS) ×2 IMPLANT
SYR TOOMEY IRRIG 70ML (MISCELLANEOUS) ×2
SYRINGE TOOMEY IRRIG 70ML (MISCELLANEOUS) ×2 IMPLANT
WATER STERILE IRR 1000ML POUR (IV SOLUTION) ×2 IMPLANT

## 2023-04-03 NOTE — Transfer of Care (Signed)
Immediate Anesthesia Transfer of Care Note  Patient: Sean Anthony  Procedure(s) Performed: TRANSURETHRAL RESECTION OF BLADDER TUMOR (TURBT) BLADDER INSTILLATION OF GEMCITABINE  Patient Location: PACU  Anesthesia Type:General  Level of Consciousness: awake and patient cooperative  Airway & Oxygen Therapy: Patient Spontanous Breathing  Post-op Assessment: Report given to RN and Post -op Vital signs reviewed and stable  Post vital signs: stable  Last Vitals:  Vitals Value Taken Time  BP 131/84 04/03/23 1140  Temp    Pulse 97 04/03/23 1144  Resp 13 04/03/23 1144  SpO2 96 % 04/03/23 1144  Vitals shown include unfiled device data.  Last Pain:  Vitals:   04/03/23 0859  TempSrc: Oral  PainSc: 0-No pain         Complications: No notable events documented.

## 2023-04-03 NOTE — Anesthesia Preprocedure Evaluation (Addendum)
Anesthesia Evaluation   Patient awake    Reviewed: Allergy & Precautions, H&P , NPO status , Patient's Chart, lab work & pertinent test results, reviewed documented beta blocker date and time   History of Anesthesia Complications Negative for: history of anesthetic complications  Airway Mallampati: I  TM Distance: >3 FB Neck ROM: full    Dental  (+) Dental Advidsory Given, Caps, Teeth Intact Permanent briges:   Pulmonary former smoker   Pulmonary exam normal        Cardiovascular Exercise Tolerance: Good hypertension, (-) angina + CAD and +CHF  (-) Past MI and (-) Cardiac Stents + dysrhythmias Atrial Fibrillation (-) Valvular Problems/Murmurs  TTE 12/31/2022: EF 45%, sep/inf HK, mod BAE, mild AR, mod MR/TR, triv PR, RVSP 25  MV 01/21/2023: no ishcemia   Neuro/Psych negative neurological ROS  negative psych ROS   GI/Hepatic negative GI ROS, Neg liver ROS,,,  Endo/Other  negative endocrine ROS    Renal/GU negative Renal ROS  negative genitourinary   Musculoskeletal   Abdominal Normal abdominal exam  (+)   Peds  Hematology negative hematology ROS (+)   Anesthesia Other Findings PAT note reviewed  Past Medical History: No date: Cancer (HCC)     Comment:  rectal cancer No date: Dysrhythmia     Comment:  atrial fibrillation No date: Hard of hearing No date: History of blood transfusion 2001: History of chemotherapy     Comment:  radiation too   Reproductive/Obstetrics negative OB ROS                             Anesthesia Physical Anesthesia Plan  ASA: 3  Anesthesia Plan: General   Post-op Pain Management: Minimal or no pain anticipated   Induction: Intravenous  PONV Risk Score and Plan: 2 and Ondansetron and Dexamethasone  Airway Management Planned: Oral ETT  Additional Equipment:   Intra-op Plan:   Post-operative Plan: Extubation in OR  Informed Consent: I have  reviewed the patients History and Physical, chart, labs and discussed the procedure including the risks, benefits and alternatives for the proposed anesthesia with the patient or authorized representative who has indicated his/her understanding and acceptance.     Dental advisory given  Plan Discussed with: Anesthesiologist, CRNA and Surgeon  Anesthesia Plan Comments:         Anesthesia Quick Evaluation

## 2023-04-03 NOTE — Progress Notes (Signed)
Patient foley unclamped to drainage at 1245 per order. Patient tolerated well. Catheter teaching will be provided.

## 2023-04-03 NOTE — Anesthesia Procedure Notes (Signed)
Procedure Name: Intubation Date/Time: 04/03/2023 11:15 AM  Performed by: Maryla Morrow., CRNAPre-anesthesia Checklist: Patient identified, Patient being monitored, Timeout performed, Emergency Drugs available and Suction available Patient Re-evaluated:Patient Re-evaluated prior to induction Oxygen Delivery Method: Circle system utilized Preoxygenation: Pre-oxygenation with 100% oxygen Induction Type: IV induction Ventilation: Mask ventilation without difficulty Laryngoscope Size: McGrath and 4 Grade View: Grade I Tube type: Oral Tube size: 7.5 mm Number of attempts: 1 Airway Equipment and Method: Stylet Placement Confirmation: ETT inserted through vocal cords under direct vision, positive ETCO2 and breath sounds checked- equal and bilateral Secured at: 22 cm Tube secured with: Tape Dental Injury: Teeth and Oropharynx as per pre-operative assessment

## 2023-04-03 NOTE — Op Note (Signed)
Date of procedure: 04/03/23  Preoperative diagnosis:  Bladder tumor  Postoperative diagnosis:  Same  Procedure: Cystoscopy, bilateral retrograde pyelograms with intraoperative interpretation TURBT, 1cm  Surgeon: Legrand Rams, MD  Anesthesia: General  Complications: None  Intraoperative findings:  Small prostate, no abnormalities on bilateral retrograde pyelograms Small papillary appearing bladder tumor around the bladder neck ranging from 12:00 to 4:00, bladder otherwise normal aside from moderate trabeculations Excellent hemostasis  EBL: minimal  Specimens: Bladder tumor  Drains: 18 French Foley, 10 mL in balloon  Indication: Sean Anthony is a 87 y.o. patient with history of bladder cancer found to have recent recurrence around the bladder neck on clinic cystoscopy.  After reviewing the management options for treatment, they elected to proceed with the above surgical procedure(s). We have discussed the potential benefits and risks of the procedure, side effects of the proposed treatment, the likelihood of the patient achieving the goals of the procedure, and any potential problems that might occur during the procedure or recuperation. Informed consent has been obtained.  Description of procedure:  The patient was taken to the operating room and general anesthesia was induced. SCDs were placed for DVT prophylaxis. The patient was placed in the dorsal lithotomy position, prepped and draped in the usual sterile fashion, and preoperative antibiotics(Ancef) were administered. A preoperative time-out was performed.   A 21 French rigid cystoscope was used to intubate the urethra and a normal-appearing urethra was followed proximally in the bladder.  The prostate was small.  Thorough cystoscopy showed moderate bladder trabeculations, and there was some small papillary tumor at the bladder neck that was difficult to visualize and ranged from 12:00 to 4:00.  This was well away from the  ureteral orifices.  There were no other abnormal findings within the bladder.  A ureteral access catheter was used to perform a retrograde pyelogram on the right side that showed no hydronephrosis or filling defects.  A retrograde pyelogram was also performed on the left side, and again showed no hydronephrosis or filling defects.  The 26 French resectoscope was inserted into the bladder and the small bipolar resecting loop was used to resect all abnormal papillary appearing tumor at the bladder neck.  There was a very small volume of tumor, but it did extend to about 40% circumferentially of the bladder neck.  All abnormal tissue was resected and excellent hemostasis was achieved.  With the bladder decompressed, no other tumor was seen, no bleeding was noted.  A 18 French Foley passed easily into the bladder with return of clear fluid, 10 mL were placed in the balloon.  The bladder was irrigated with 500 mL of sterile water.  The bladder was drained and 2 g / 50 mL gemcitabine were instilled into the bladder and clamped.  Disposition: Stable to PACU  Plan: -Unclamp Foley at 12:45 PM and allow to drain, patient will discharge with Foley and need Foley removal on Monday or Tuesday -Follow-up with MD in 2 weeks to review pathology  Legrand Rams, MD

## 2023-04-03 NOTE — Progress Notes (Signed)
Education of the catheter was given in the presence of the patient and son. Demonstration was given of the emptying and changing of the bag. Questions were answered and repeat demonstration was done for confirmation. The son and the patient both verbalized understanding of the education.

## 2023-04-03 NOTE — H&P (Signed)
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  He deferred second look TURBT and BCG with his age.   Recent clinic cystoscopy showed recurrence of small papillary tumor at the bladder neck.  I also ordered a repeat CT urogram, but this has not yet been completed, we will perform retrograde pyelograms today.   PMH: Past Medical History:  Diagnosis Date   Anxiety    Aortic atherosclerosis (HCC)    Atrial fibrillation (HCC)    a.) CHA2DS2-VASc = 4 (age x2, HTN, vascular disease history) as of 04/02/2023; b.) cardiac rate/rhythm maintained on oral metoprolol succinate; chronically anticoagulated using rivaroxaban   B12 deficiency    Bladder tumor    CAD (coronary artery disease)    a.) cCTA 11/27/2022: Ca2+ 1759 (76th %ile) mainly in LAD and RCA distributions; b.) MV 11/15/2020: mixed inf defext c/w ishcemia; c.) MV 01/21/2023: no ishcemia.   Cardiomyopathy, unspecified type (HCC)    a.) TTE 11/15/2020: EF 45% sep/inf HK, mod BAE, mod AR/MR/TR, mild PR, RVSP 32.7; b.) MV 11/15/2020: EF 48%; c.) TTE 12/31/2022: EF 45%, sep/inf HK, mod BAE, mild AR, mod MR/TR, triv PR, RVSP 25   Depression    Endocarditis    Hard of hearing    Heart murmur    History of blood transfusion    History of right cataract extraction 2021   HLD (hyperlipidemia)    HTN (hypertension)    On rivaroxaban therapy    Pre-diabetes    Rectal cancer (HCC) 2001   a.) s/p anterior surgical resection + systemic chemotherapy + XRT   Umbilical hernia     Surgical History: Past Surgical History:  Procedure Laterality Date   BLADDER INSTILLATION N/A 09/06/2021   Procedure: BLADDER INSTILLATION OF  GEMCITABINE;  Surgeon: Sondra Come, MD;  Location: [facility];  Service: Urology;  Laterality: N/A;   CATARACT EXTRACTION W/PHACO Right 04/08/2019   Procedure: CATARACT EXTRACTION PHACO AND INTRAOCULAR LENS PLACEMENT (IOC) RIGHT;  Surgeon: Galen Manila, MD;  Location: [facility];  Service: Ophthalmology;  Laterality: Right;  [place] 00:56.6 CDE 10.34 FLUID PACK LOT # [id number]   CYSTOSCOPY WITH BIOPSY N/A 09/06/2021   Procedure: CYSTOSCOPY WITH BLADDER BIOPSY;  Surgeon: Sondra Come, MD;  Location: [facility];  Service: Urology;  Laterality: N/A;   CYSTOSCOPY WITH FULGERATION N/A 09/06/2021   Procedure: CYSTOSCOPY WITH FULGERATION;  Surgeon: Sondra Come, MD;  Location: [facility];  Service: Urology;  Laterality: N/A;   dental implants     EYE SURGERY Left 2020   Cataract surgery   HERNIA REPAIR  2004   LAPAROSCOPIC LOW ANTERIOR RESECTION  2001   also had chemo and radiation therapy     Family History: History reviewed. No pertinent family history.  Social History:  reports that he has quit smoking. He has been exposed to tobacco smoke. He has never used smokeless tobacco. He reports current alcohol use of about 1.0 - 2.0 standard drink of alcohol per week. He reports that he does not use drugs.  Physical Exam: BP (!) 139/90   Pulse 95   Temp (!) 97 F (36.1 C) (Oral)   Resp 14   Ht 6\' 2"  (1.88 m)  Wt 79.4 kg   SpO2 98%   BMI 22.47 kg/m    Constitutional:  Alert and oriented, No acute distress. Cardiovascular: Regular rate and rhythm Respiratory: Clear to auscultation bilaterally GI: Abdomen is soft, nontender, nondistended, no abdominal masses   Laboratory Data: Urine culture no growth   Assessment & Plan:   87 year old male with history of noninvasive bladder cancer with recent papillary recurrence at the bladder neck on clinic cystoscopy.  We discussed transurethral resection of bladder tumor (TURBT) and risks and benefits at length. This is typically a 1 to  2-hour procedure done under general anesthesia in the operating room.  A scope is inserted through the urethra and used to resect abnormal tissue within the bladder, which is then sent to the pathologist to determine grade and stage of the tumor.  Risks include bleeding, infection, need for temporary Foley placement, and bladder perforation.  Treatment strategies are based on the type of tumor and depth of invasion.  We briefly reviewed the different treatment pathways for non-muscle invasive and muscle invasive bladder cancer.  TURBT with gemcitabine, bilateral retrograde pyelograms today   Legrand Rams, MD 04/03/2023  [facility] [address], [address] [place], [place] [address] [phone]

## 2023-04-06 ENCOUNTER — Ambulatory Visit: Payer: Medicare Other | Admitting: Urology

## 2023-04-06 ENCOUNTER — Encounter: Payer: Medicare Other | Admitting: Urology

## 2023-04-06 ENCOUNTER — Encounter: Payer: Self-pay | Admitting: Urology

## 2023-04-06 VITALS — BP 137/66 | HR 72 | Ht 74.0 in

## 2023-04-06 DIAGNOSIS — Z09 Encounter for follow-up examination after completed treatment for conditions other than malignant neoplasm: Secondary | ICD-10-CM

## 2023-04-06 DIAGNOSIS — D303 Benign neoplasm of bladder: Secondary | ICD-10-CM

## 2023-04-06 DIAGNOSIS — D494 Neoplasm of unspecified behavior of bladder: Secondary | ICD-10-CM

## 2023-04-06 LAB — SURGICAL PATHOLOGY

## 2023-04-06 NOTE — Progress Notes (Signed)
Catheter Removal Patient underwent a TURBT on April 03, 2023 for a small papillary appearing bladder tumor.  His postprocedural course has been as expected and uneventful.  His urine in the leg bag is clear yellow with 2 small red clots.  Surgical pathology still pending.   Patient is present today for a catheter removal.  10 ml of water was drained from the balloon. A 18 FR foley cath was removed from the bladder, no complications were noted. Patient tolerated well.  Performed by: Michiel Cowboy, PA-C   Follow up/ Additional notes: Return for Keep follow up appointment with Dr. Richardo Hanks .   We discussed what to expect postoperatively after undergoing a TURBT and red flag signs.  He was allowed to ask questions voices understanding.

## 2023-04-10 NOTE — Anesthesia Postprocedure Evaluation (Signed)
Anesthesia Post Note  Patient: Sean Anthony  Procedure(s) Performed: TRANSURETHRAL RESECTION OF BLADDER TUMOR (TURBT) BLADDER INSTILLATION OF GEMCITABINE CYSTOSCOPY WITH RETROGRADE PYELOGRAM  Patient location during evaluation: PACU Anesthesia Type: General Level of consciousness: awake and alert Pain management: pain level controlled Vital Signs Assessment: post-procedure vital signs reviewed and stable Respiratory status: spontaneous breathing, nonlabored ventilation and respiratory function stable Cardiovascular status: blood pressure returned to baseline and stable Postop Assessment: no apparent nausea or vomiting Anesthetic complications: no   There were no known notable events for this encounter.   Last Vitals:  Vitals:   04/03/23 1315 04/03/23 1423  BP: (!) 133/93 128/87  Pulse: 61 60  Resp: 19 18  Temp:  (!) 36.3 C  SpO2: 99% 99%    Last Pain:  Vitals:   04/03/23 1423  TempSrc: Oral  PainSc: 0-No pain                 Foye Deer

## 2023-04-21 ENCOUNTER — Ambulatory Visit: Payer: Medicare Other | Admitting: Urology

## 2023-04-21 ENCOUNTER — Encounter: Payer: Self-pay | Admitting: Urology

## 2023-04-21 VITALS — BP 126/69 | HR 87 | Ht 74.0 in

## 2023-04-21 DIAGNOSIS — Z08 Encounter for follow-up examination after completed treatment for malignant neoplasm: Secondary | ICD-10-CM

## 2023-04-21 DIAGNOSIS — Z8551 Personal history of malignant neoplasm of bladder: Secondary | ICD-10-CM | POA: Diagnosis not present

## 2023-04-21 DIAGNOSIS — C672 Malignant neoplasm of lateral wall of bladder: Secondary | ICD-10-CM

## 2023-04-21 NOTE — Progress Notes (Signed)
   04/21/2023 3:58 PM   Sean Anthony [birth year] [id number]  Reason for visit: Follow up bladder cancer, TURBT results  HPI: 87 year old male presented with gross hematuria in April 2023 and was found of a 2.5 cm bladder tumor, pathology showed HG Ta with focal glandular differentiation, muscle was present and not involved with tumor.  With his age he deferred second look TURBT and BCG.  He did receive gemcitabine at his initial TURBT.  He underwent routine surveillance cystoscopy in November 2024 which showed papillary lesions at the bladder neck, and he underwent TURBT on 04/03/2023 with postop gemcitabine.  Foley catheter was removed 3 days postop and he has been doing well since that time with no urinary symptoms or gross hematuria.  Pathology actually showed nephrogenic adenoma which was relatively surprising to me.  Reassurance was provided regarding his benign pathology, and I recommended continuing routine surveillance cystoscopy.  He is due for surveillance cystoscopy in April 2024, and followed by that if no evidence of recurrence can space to every 6 months.  Will continue to send cytology, and would only recommend repeat TURBT if cytology positive.  RTC 4 months clinic cystoscopy and cytology, space to every 6 months if benign  Sondra Come, MD  [facility] [address] [place], [place] [address] [phone]

## 2023-04-21 NOTE — Patient Instructions (Signed)

## 2023-08-18 ENCOUNTER — Ambulatory Visit: Payer: Self-pay | Admitting: Urology

## 2023-08-18 VITALS — BP 134/76 | HR 80 | Ht 74.0 in | Wt 179.5 lb

## 2023-08-18 DIAGNOSIS — D494 Neoplasm of unspecified behavior of bladder: Secondary | ICD-10-CM

## 2023-08-18 DIAGNOSIS — Z8551 Personal history of malignant neoplasm of bladder: Secondary | ICD-10-CM | POA: Diagnosis not present

## 2023-08-18 NOTE — Progress Notes (Signed)
 Bladder cancer surveillance note   INDICATION Hx bladder cancer   UROLOGIC HISTORY Sean Anthony is a 88 y.o. male who presented with gross hematuria in April 2023 and found to have a 2.5 cm bladder tumor.  He deferred BCG.   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 Cystoscopy was performed with the a flexible cystoscope. The urethra, bladder neck and entire bladder was visualized in a standard fashion. The ureteral orifices were visualized in their normal location and orientation.  Bladder mucosa was grossly normal throughout  RTC cystoscopy 6 months  Legrand Rams, MD 08/18/2023

## 2023-11-19 ENCOUNTER — Telehealth: Payer: Self-pay

## 2023-11-19 NOTE — Telephone Encounter (Signed)
 Pt left a message on the triage line stating he was passing blood in his urine. He wanted to schedule an appointment.   Called pt back n/s. LMTRC.

## 2024-01-11 NOTE — Progress Notes (Signed)
 New Patient Visit   Chief Complaint:No chief complaint on file.  Date of Service: 01/11/2024 Date of Birth: [birth year] PCP: Steva Clotilda Conn, NP [address] [place] [address]  History of Present Illness:   History of Present Illness Sean Anthony is an 88 year old male with atrial fibrillation who presents for a cardiovascular follow-up.  He manages his atrial fibrillation with Xarelto and experiences minor bleeding when bumping into objects. No dark stools, bloody stools, or significant epistaxis. No chest pain, shortness of breath, or palpitations.  He was prescribed rosuvastatin after a calcium score showed a lot of calcium buildup in the arteries of his heart.  He takes a B12 supplement, which he resumed after a suggestion from a non-physician health visitor to discontinue it. He continues to take the B12 supplement.   Past Medical and Surgical History  Past Medical History Past Medical History:  Diagnosis Date  . Acute cataract    Right eye  . Arrhythmia   . Atrial fibrillation (CMS/HHS-HCC)   . B12 deficiency 07/07/2017  . Fecal incontinence    S/p resection, controlled with Imodium  . Hyperlipidemia   . Hypertension   . Rectal cancer (CMS/HHS-HCC)    S/p resection, chemo and XRT    Past Surgical History He has a past surgical history that includes Hernia repair (Bilateral); Anterior rectal resection; egd (06/06/2013); Colonoscopy (07/01/2004); Colonoscopy (06/06/2013); Appendectomy; Cataract extraction (Right, 2021); and Colon surgery.   Medications and Allergies  Current Medications  Current Outpatient Medications on File Prior to Visit  Medication Sig Dispense Refill  . cyanocobalamin (VITAMIN B12) 1000 MCG tablet Take 1,000 mcg by mouth once daily    . diclofenac (VOLTAREN) 1 % topical gel Apply 2 g topically 4 (four) times daily 100 g 11  . multivitamin tablet Take 1 tablet by mouth once daily.    . rivaroxaban (XARELTO) 20 mg tablet Take 1  tablet (20 mg total) by mouth once daily 90 tablet 3   No current facility-administered medications on file prior to visit.    Allergies: Penicillins  Social and Family History  Social History  reports that he has quit smoking. His smoking use included cigarettes. He has never used smokeless tobacco. He reports current alcohol use of about 2.0 standard drinks of alcohol per week. He reports that he does not use drugs.  Family History Family History  Problem Relation Name Age of Onset  . Heart failure Mother    . Hernia Father         Strangled  . Dementia Father    . Breast cancer Sister    . Colon cancer Sister    . Ovarian cancer Sister      Review of Systems   Review of Systems  Cardiovascular:  Negative for chest pain, palpitations, orthopnea, claudication, leg swelling and PND.      Physical Examination   Vitals:BP 122/66   Pulse 78   Resp 16   Ht 186.7 cm (6' 1.5)   Wt 82.1 kg (181 lb)   SpO2 98%   BMI 23.56 kg/m  Ht:186.7 cm (6' 1.5) Wt:82.1 kg (181 lb) ADJ:Anib surface area is 2.06 meters squared. Body mass index is 23.56 kg/m.  Physical Exam Vitals reviewed.  HENT:     Head: Normocephalic and atraumatic.  Cardiovascular:     Rate and Rhythm: Normal rate and regular rhythm.     Pulses: Normal pulses.     Heart sounds: Normal heart sounds. No murmur  heard. Pulmonary:     Effort: Pulmonary effort is normal.     Breath sounds: Normal breath sounds.  Abdominal:     General: Bowel sounds are normal.  Musculoskeletal:     Right lower leg: No edema.     Left lower leg: No edema.  Skin:    General: Skin is warm and dry.  Neurological:     Mental Status: He is alert.       Data & Results   Recent Labs    03/25/22 1556 12/16/22 1553 03/26/23 1455  CHOLTOTAL 173 173 152  HDL 54.8 56.0 54.9  LDLCALC 74 89 63  VLDL 44 28 34  TRIG 222* 138 170    Recent Labs    03/22/21 1455 03/25/22 1556 12/16/22 1553 03/26/23 1455  NA 145 142  --   140  K 3.9 4.2  --  4.4  BUN 17 13  --  16  CREATININE 1.0 0.9  --  1.0  CO2 30.1 27.0  --  25.3  GLUCOSE 134* 84  --  88  ALT 19 20 21 22   AST 27 27 25 31   TBILI 0.6 0.5 0.6 0.5  ALB 4.6 4.3 4.5 4.5    Recent Labs    03/22/21 1455 03/25/22 1556 03/26/23 1455  WBC 4.6 4.6 4.2  HGB 14.3 13.8* 14.2  HCT 41.3 40.4 42.3  MCV 95.8 95.7 97.7  PLT 148* 158 167    Recent Labs    03/22/21 1455 03/25/22 1556 03/26/23 1455  TSH 2.209 2.599 2.341  HGBA1C 5.9* 5.8* 5.9*        Assessment   88 y.o. male with  Encounter Diagnoses  Name Primary?  . High coronary artery calcium score Yes  . Atrial fibrillation, unspecified type (CMS/HHS-HCC)   . Hyperlipidemia, mixed   . Essential hypertension, benign         Plan   No orders of the defined types were placed in this encounter.  Assessment & Plan Atrial fibrillation, anticoagulated with Xarelto Atrial fibrillation is well-controlled with Xarelto. No reports of bleeding issues such as dark stools, bloody stools, or epistaxis. No chest pain, shortness of breath, or palpitations reported.  Atherosclerotic heart disease of native coronary arteries, stable, on statin therapy Atherosclerotic heart disease is stable. Rosuvastatin is prescribed to manage cholesterol and stabilize calcium buildup in coronary arteries. No side effects reported from rosuvastatin. - Continue rosuvastatin therapy  Hyperlipidemia, on statin therapy Hyperlipidemia is managed with rosuvastatin. The medication is well-tolerated and effective in stabilizing cholesterol levels. - Continue rosuvastatin therapy  Return in about 1 year (around 01/10/2025).   Attestation Statement:   I personally performed the service, non-incident to. (WP)   SABINA CUSTOVIC, DO    Sabina Custovic, DO

## 2024-02-16 ENCOUNTER — Ambulatory Visit (INDEPENDENT_AMBULATORY_CARE_PROVIDER_SITE_OTHER): Admitting: Urology

## 2024-02-16 VITALS — BP 139/70 | HR 64 | Wt 179.0 lb

## 2024-02-16 DIAGNOSIS — Z8551 Personal history of malignant neoplasm of bladder: Secondary | ICD-10-CM | POA: Diagnosis not present

## 2024-02-16 MED ORDER — LIDOCAINE HCL URETHRAL/MUCOSAL 2 % EX GEL
1.0000 | Freq: Once | CUTANEOUS | Status: AC
  Administered 2024-02-16: 1 via URETHRAL

## 2024-02-16 NOTE — Progress Notes (Signed)
 Bladder cancer surveillance note   INDICATION Hx bladder cancer   UROLOGIC HISTORY Sean Anthony is a 88 y.o. male who presented with gross hematuria in April 2023 and found to have a 2.5 cm bladder tumor.  He deferred BCG.   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 Cystoscopy was performed with the a flexible cystoscope. The urethra, bladder neck and entire bladder was visualized in a standard fashion. The ureteral orifices were visualized in their normal location and orientation.  There is some very subtle papillary change at the base of the bladder near a diverticulum.  Directed cytology sent.  Follow-up cytology, if positive for suspicious pursue bladder biopsy and fulguration with gemcitabine .  If atypical or benign RTC cystoscopy 3 to 4 months  Redell Burnet, MD 02/16/2024

## 2024-02-23 LAB — CYTOLOGY PLUS MONITORING PROFILE: PAP & FEULGEN

## 2024-02-25 ENCOUNTER — Ambulatory Visit: Payer: Self-pay | Admitting: Urology

## 2024-02-25 NOTE — Telephone Encounter (Signed)
Called pt no answer. Mychart message sent.  

## 2024-02-25 NOTE — Telephone Encounter (Signed)
-----   Message from Redell JAYSON Burnet sent at 02/25/2024  8:49 AM EDT ----- Metta news, no definitive cancer cells on urine cytology.  Please schedule 3 month cystoscopy  Redell Burnet, MD 02/25/2024  ----- Message ----- From: Interface, [facility] Lab Results In Sent: 02/23/2024   1:09 PM EDT To: Redell JAYSON Burnet, MD

## 2024-03-23 ENCOUNTER — Encounter: Payer: Self-pay | Admitting: *Deleted

## 2024-05-31 ENCOUNTER — Ambulatory Visit: Admitting: Urology

## 2024-05-31 VITALS — BP 155/69 | HR 93 | Ht 74.0 in | Wt 180.0 lb

## 2024-05-31 DIAGNOSIS — Z8551 Personal history of malignant neoplasm of bladder: Secondary | ICD-10-CM | POA: Diagnosis not present

## 2024-05-31 MED ORDER — LIDOCAINE HCL URETHRAL/MUCOSAL 2 % EX GEL
1.0000 | Freq: Once | CUTANEOUS | Status: AC
  Administered 2024-05-31: 1 via URETHRAL

## 2024-05-31 NOTE — Addendum Note (Signed)
 Addended by: ELOUISE SANTA BROCKS on: 05/31/2024 01:37 PM   Modules accepted: Orders

## 2024-05-31 NOTE — Progress Notes (Signed)
 Bladder cancer surveillance note   INDICATION Hx bladder cancer   UROLOGIC HISTORY Sean Anthony is a 89 y.o. male who presented with gross hematuria in April 2023 and found to have a 2.5 cm bladder tumor.  He deferred BCG.   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 Cystoscopy was performed with the a flexible cystoscope. The urethra, bladder neck and entire bladder was visualized in a standard fashion. The ureteral orifices were visualized in their normal location and orientation.  There is some very subtle papillary change at the posterior bladder near a diverticulum.  Directed cytology sent.  Follow-up cytology, if positive for suspicious pursue bladder biopsy and fulguration with gemcitabine .  If atypical or benign RTC cystoscopy 4-6 months  Redell Burnet, MD 05/31/2024

## 2024-06-07 LAB — "CYTOLOGY PLUS MONITORING PROFILE: PAP & FEULGEN "

## 2024-06-09 ENCOUNTER — Ambulatory Visit: Payer: Self-pay | Admitting: Urology

## 2024-06-09 NOTE — Telephone Encounter (Signed)
 Appt scheduled

## 2024-12-14 ENCOUNTER — Other Ambulatory Visit: Admitting: Urology
